# Patient Record
Sex: Female | Born: 1935 | Race: Black or African American | Hispanic: No | Smoking: Never smoker
Health system: Southern US, Community
[De-identification: ages and names within clinical notes are randomized; demographics above are authoritative.]

## PROBLEM LIST (undated history)

## (undated) DIAGNOSIS — I10 Essential (primary) hypertension: Secondary | ICD-10-CM

## (undated) DIAGNOSIS — E785 Hyperlipidemia, unspecified: Secondary | ICD-10-CM

## (undated) DIAGNOSIS — H269 Unspecified cataract: Secondary | ICD-10-CM

## (undated) DIAGNOSIS — F0390 Unspecified dementia without behavioral disturbance: Secondary | ICD-10-CM

## (undated) HISTORY — DX: Essential (primary) hypertension: I10

## (undated) HISTORY — DX: Hyperlipidemia, unspecified: E78.5

## (undated) HISTORY — PX: VAGINAL HYSTERECTOMY: SUR661

## (undated) HISTORY — PX: KNEE SURGERY: SHX244

---

## 1997-10-12 ENCOUNTER — Ambulatory Visit (HOSPITAL_COMMUNITY): Admission: RE | Admit: 1997-10-12 | Discharge: 1997-10-12 | Payer: Self-pay | Admitting: Gastroenterology

## 1998-02-05 ENCOUNTER — Ambulatory Visit (HOSPITAL_COMMUNITY): Admission: RE | Admit: 1998-02-05 | Discharge: 1998-02-05 | Payer: Self-pay | Admitting: Internal Medicine

## 1998-10-16 ENCOUNTER — Ambulatory Visit (HOSPITAL_COMMUNITY): Admission: RE | Admit: 1998-10-16 | Discharge: 1998-10-16 | Payer: Self-pay | Admitting: Internal Medicine

## 1998-10-16 ENCOUNTER — Encounter: Payer: Self-pay | Admitting: Internal Medicine

## 1999-02-28 ENCOUNTER — Encounter: Payer: Self-pay | Admitting: Internal Medicine

## 1999-02-28 ENCOUNTER — Ambulatory Visit (HOSPITAL_COMMUNITY): Admission: RE | Admit: 1999-02-28 | Discharge: 1999-02-28 | Payer: Self-pay | Admitting: Internal Medicine

## 1999-10-26 ENCOUNTER — Emergency Department (HOSPITAL_COMMUNITY): Admission: EM | Admit: 1999-10-26 | Discharge: 1999-10-26 | Payer: Self-pay | Admitting: Emergency Medicine

## 1999-10-26 ENCOUNTER — Encounter: Payer: Self-pay | Admitting: Emergency Medicine

## 1999-11-26 ENCOUNTER — Encounter: Payer: Self-pay | Admitting: Internal Medicine

## 1999-11-26 ENCOUNTER — Ambulatory Visit (HOSPITAL_COMMUNITY): Admission: RE | Admit: 1999-11-26 | Discharge: 1999-11-26 | Payer: Self-pay | Admitting: Internal Medicine

## 2000-02-15 ENCOUNTER — Emergency Department (HOSPITAL_COMMUNITY): Admission: EM | Admit: 2000-02-15 | Discharge: 2000-02-15 | Payer: Self-pay | Admitting: Emergency Medicine

## 2000-04-23 ENCOUNTER — Encounter: Admission: RE | Admit: 2000-04-23 | Discharge: 2000-04-30 | Payer: Self-pay | Admitting: Family Medicine

## 2000-09-11 ENCOUNTER — Encounter: Payer: Self-pay | Admitting: Orthopedic Surgery

## 2000-09-16 ENCOUNTER — Inpatient Hospital Stay (HOSPITAL_COMMUNITY): Admission: RE | Admit: 2000-09-16 | Discharge: 2000-09-21 | Payer: Self-pay | Admitting: Orthopedic Surgery

## 2000-09-16 ENCOUNTER — Encounter: Payer: Self-pay | Admitting: Orthopedic Surgery

## 2000-10-15 ENCOUNTER — Encounter: Admission: RE | Admit: 2000-10-15 | Discharge: 2000-11-13 | Payer: Self-pay | Admitting: Orthopedic Surgery

## 2000-11-26 ENCOUNTER — Encounter: Payer: Self-pay | Admitting: Internal Medicine

## 2000-11-26 ENCOUNTER — Ambulatory Visit (HOSPITAL_COMMUNITY): Admission: RE | Admit: 2000-11-26 | Discharge: 2000-11-26 | Payer: Self-pay | Admitting: Internal Medicine

## 2000-12-14 ENCOUNTER — Encounter: Payer: Self-pay | Admitting: Orthopedic Surgery

## 2000-12-14 ENCOUNTER — Inpatient Hospital Stay (HOSPITAL_COMMUNITY): Admission: RE | Admit: 2000-12-14 | Discharge: 2000-12-19 | Payer: Self-pay | Admitting: Orthopedic Surgery

## 2001-01-12 ENCOUNTER — Encounter: Admission: RE | Admit: 2001-01-12 | Discharge: 2001-04-12 | Payer: Self-pay | Admitting: Orthopedic Surgery

## 2001-05-26 ENCOUNTER — Encounter: Payer: Self-pay | Admitting: Internal Medicine

## 2001-05-26 ENCOUNTER — Encounter: Admission: RE | Admit: 2001-05-26 | Discharge: 2001-05-26 | Payer: Self-pay | Admitting: Internal Medicine

## 2001-08-27 ENCOUNTER — Encounter: Admission: RE | Admit: 2001-08-27 | Discharge: 2001-08-27 | Payer: Self-pay | Admitting: Internal Medicine

## 2001-08-27 ENCOUNTER — Encounter: Payer: Self-pay | Admitting: Internal Medicine

## 2002-02-08 ENCOUNTER — Encounter: Admission: RE | Admit: 2002-02-08 | Discharge: 2002-02-08 | Payer: Self-pay | Admitting: Internal Medicine

## 2002-02-08 ENCOUNTER — Encounter: Payer: Self-pay | Admitting: Internal Medicine

## 2002-02-16 ENCOUNTER — Encounter: Admission: RE | Admit: 2002-02-16 | Discharge: 2002-02-16 | Payer: Self-pay | Admitting: Internal Medicine

## 2002-02-16 ENCOUNTER — Encounter: Payer: Self-pay | Admitting: Internal Medicine

## 2002-11-21 ENCOUNTER — Emergency Department (HOSPITAL_COMMUNITY): Admission: EM | Admit: 2002-11-21 | Discharge: 2002-11-21 | Payer: Self-pay

## 2003-02-10 ENCOUNTER — Encounter: Payer: Self-pay | Admitting: Internal Medicine

## 2003-02-10 ENCOUNTER — Encounter: Admission: RE | Admit: 2003-02-10 | Discharge: 2003-02-10 | Payer: Self-pay | Admitting: Internal Medicine

## 2003-11-06 ENCOUNTER — Emergency Department (HOSPITAL_COMMUNITY): Admission: EM | Admit: 2003-11-06 | Discharge: 2003-11-06 | Payer: Self-pay | Admitting: Emergency Medicine

## 2004-02-23 ENCOUNTER — Encounter: Admission: RE | Admit: 2004-02-23 | Discharge: 2004-02-23 | Payer: Self-pay | Admitting: Internal Medicine

## 2004-05-02 ENCOUNTER — Encounter (INDEPENDENT_AMBULATORY_CARE_PROVIDER_SITE_OTHER): Payer: Self-pay | Admitting: Specialist

## 2004-05-02 ENCOUNTER — Ambulatory Visit (HOSPITAL_COMMUNITY): Admission: RE | Admit: 2004-05-02 | Discharge: 2004-05-02 | Payer: Self-pay | Admitting: Gastroenterology

## 2004-10-29 ENCOUNTER — Ambulatory Visit: Payer: Self-pay | Admitting: Oncology

## 2005-01-02 ENCOUNTER — Ambulatory Visit: Payer: Self-pay | Admitting: Oncology

## 2005-04-04 ENCOUNTER — Ambulatory Visit: Payer: Self-pay | Admitting: Oncology

## 2005-04-25 ENCOUNTER — Emergency Department (HOSPITAL_COMMUNITY): Admission: EM | Admit: 2005-04-25 | Discharge: 2005-04-25 | Payer: Self-pay | Admitting: Emergency Medicine

## 2005-06-06 ENCOUNTER — Encounter: Admission: RE | Admit: 2005-06-06 | Discharge: 2005-06-06 | Payer: Self-pay | Admitting: Internal Medicine

## 2005-10-03 ENCOUNTER — Ambulatory Visit: Payer: Self-pay | Admitting: Oncology

## 2005-10-06 LAB — CBC WITH DIFFERENTIAL/PLATELET
BASO%: 0.8 % (ref 0.0–2.0)
Basophils Absolute: 0 10*3/uL (ref 0.0–0.1)
EOS%: 4 % (ref 0.0–7.0)
Eosinophils Absolute: 0.1 10*3/uL (ref 0.0–0.5)
HCT: 38.8 % (ref 34.8–46.6)
HGB: 12.9 g/dL (ref 11.6–15.9)
LYMPH%: 56.6 % — ABNORMAL HIGH (ref 14.0–48.0)
MCH: 26.6 pg (ref 26.0–34.0)
MCHC: 33.1 g/dL (ref 32.0–36.0)
MCV: 80.4 fL — ABNORMAL LOW (ref 81.0–101.0)
MONO#: 0.3 10*3/uL (ref 0.1–0.9)
MONO%: 9.8 % (ref 0.0–13.0)
NEUT#: 0.9 10*3/uL — ABNORMAL LOW (ref 1.5–6.5)
NEUT%: 28.8 % — ABNORMAL LOW (ref 39.6–76.8)
Platelets: 247 10*3/uL (ref 145–400)
RBC: 4.84 10*6/uL (ref 3.70–5.32)
RDW: 14.4 % (ref 11.3–14.5)
WBC: 3.1 10*3/uL — ABNORMAL LOW (ref 3.9–10.0)
lymph#: 1.8 10*3/uL (ref 0.9–3.3)

## 2005-10-10 LAB — ANTI-NEUTROPHILIC CYTOPLASMIC ANTIBODY PANEL: Anti-Neutrophil Cyto Ab, IgG: 1:80 {titer} — ABNORMAL HIGH

## 2006-04-02 ENCOUNTER — Ambulatory Visit: Payer: Self-pay | Admitting: Oncology

## 2006-07-06 ENCOUNTER — Encounter: Admission: RE | Admit: 2006-07-06 | Discharge: 2006-07-06 | Payer: Self-pay | Admitting: Internal Medicine

## 2007-09-17 ENCOUNTER — Encounter: Admission: RE | Admit: 2007-09-17 | Discharge: 2007-09-17 | Payer: Self-pay | Admitting: Family Medicine

## 2007-10-04 ENCOUNTER — Other Ambulatory Visit: Admission: RE | Admit: 2007-10-04 | Discharge: 2007-10-04 | Payer: Self-pay | Admitting: Family Medicine

## 2009-05-31 ENCOUNTER — Emergency Department (HOSPITAL_COMMUNITY): Admission: EM | Admit: 2009-05-31 | Discharge: 2009-05-31 | Payer: Self-pay | Admitting: Emergency Medicine

## 2009-07-31 ENCOUNTER — Encounter: Admission: RE | Admit: 2009-07-31 | Discharge: 2009-07-31 | Payer: Self-pay | Admitting: Gastroenterology

## 2010-09-24 LAB — URINE CULTURE: Colony Count: 25000

## 2010-09-24 LAB — COMPREHENSIVE METABOLIC PANEL
ALT: 17 U/L (ref 0–35)
AST: 20 U/L (ref 0–37)
Albumin: 4.1 g/dL (ref 3.5–5.2)
Alkaline Phosphatase: 59 U/L (ref 39–117)
BUN: 17 mg/dL (ref 6–23)
CO2: 27 mEq/L (ref 19–32)
Calcium: 8.8 mg/dL (ref 8.4–10.5)
Chloride: 105 mEq/L (ref 96–112)
Creatinine, Ser: 0.98 mg/dL (ref 0.4–1.2)
GFR calc Af Amer: >60 mL/min (ref >60)
GFR calc non Af Amer: 55 mL/min — ABNORMAL LOW (ref >60)
Glucose, Bld: 115 mg/dL — ABNORMAL HIGH (ref 70–99)
Potassium: 3.6 mEq/L (ref 3.5–5.1)
Sodium: 139 mEq/L (ref 135–145)
Total Bilirubin: 0.9 mg/dL (ref 0.3–1.2)
Total Protein: 7.5 g/dL (ref 6.0–8.3)

## 2010-09-24 LAB — URINALYSIS, ROUTINE W REFLEX MICROSCOPIC
Bilirubin Urine: NEGATIVE
Glucose, UA: NEGATIVE mg/dL
Hgb urine dipstick: NEGATIVE
Ketones, ur: NEGATIVE mg/dL
Nitrite: NEGATIVE
Protein, ur: NEGATIVE mg/dL
Specific Gravity, Urine: 1.025 (ref 1.005–1.030)
Urobilinogen, UA: 0.2 mg/dL (ref 0.0–1.0)
pH: 7.5 (ref 5.0–8.0)

## 2010-09-24 LAB — CBC
HCT: 39.2 % (ref 36.0–46.0)
Hemoglobin: 12.9 g/dL (ref 12.0–15.0)
MCHC: 33 g/dL (ref 30.0–36.0)
MCV: 81.8 fL (ref 78.0–100.0)
Platelets: 223 10*3/uL (ref 150–400)
RBC: 4.79 MIL/uL (ref 3.87–5.11)
RDW: 14.8 % (ref 11.5–15.5)
WBC: 2.3 10*3/uL — ABNORMAL LOW (ref 4.0–10.5)

## 2010-09-24 LAB — URINE MICROSCOPIC-ADD ON

## 2010-09-24 LAB — DIFFERENTIAL
Basophils Absolute: 0 10*3/uL (ref 0.0–0.1)
Basophils Relative: 1 % (ref 0–1)
Eosinophils Absolute: 0 10*3/uL (ref 0.0–0.7)
Eosinophils Relative: 2 % (ref 0–5)
Lymphocytes Relative: 50 % — ABNORMAL HIGH (ref 12–46)
Lymphs Abs: 1.2 10*3/uL (ref 0.7–4.0)
Monocytes Absolute: 0.2 10*3/uL (ref 0.1–1.0)
Monocytes Relative: 9 % (ref 3–12)
Neutro Abs: 0.9 10*3/uL — ABNORMAL LOW (ref 1.7–7.7)
Neutrophils Relative %: 38 % — ABNORMAL LOW (ref 43–77)

## 2010-10-18 ENCOUNTER — Other Ambulatory Visit: Payer: Self-pay | Admitting: Family Medicine

## 2010-10-18 DIAGNOSIS — R4182 Altered mental status, unspecified: Secondary | ICD-10-CM

## 2010-10-24 ENCOUNTER — Ambulatory Visit
Admission: RE | Admit: 2010-10-24 | Discharge: 2010-10-24 | Disposition: A | Discharge status: Home / Self Care | Payer: Medicare Other | Source: Ambulatory Visit | Attending: Family Medicine | Admitting: Family Medicine

## 2010-10-24 DIAGNOSIS — R4182 Altered mental status, unspecified: Secondary | ICD-10-CM

## 2010-11-08 NOTE — Procedures (Signed)
[place]. [facility]  Patient:    Diana Allison, Diana Allison                     MRN: [id number] Proc. Date: 12/14/00 Adm. Date:  [id number] Attending:  Colbert Ewing                           Procedure Report  PROCEDURE PERFORMED:  ANESTHESIOLOGIST:  Edwin Cap. Zoila Shutter, M.D.  INDICATIONS FOR PROCEDURE: The patient is a 77 year old African-American female with a diagnosis of severe degenerative joint disease of the knee scheduled for total knee replacement and for whom epidural analgesia in the postoperative period has been requested as part of medical surgical management and explained to the patient and understood and accepted preoperatively.  DESCRIPTION OF PROCEDURE:  At the termination of surgery while still under general anesthesia, Ms. Proctor was turned to the left lateral decubitus position. Her back was prepped with Betadine and draped in the usual sterile fashion.  Using a midline approach and loss of resistance technique, a peridural tap was accomplished at the L2-3 interspace with a 17 gauge Tuohy needle after multiple difficult attempts at L1-2 also paramedian with unsatisfactory results.  Aspiration for blood and CSF was negative.  A total of 10 cc of 0.5% lidocaine with 100 mcg of fentanyl was infused.  This was followed by the passage of a peridural catheter 5 cm cephalad.  The catheter was then checked for patency and affixed to the patients back.  The patient was then returned to the supine position, awakened and brought to the post anesthesia care unit where her peridural catheter will be connected to an infusion pump with a mixture of fentanyl 5 mcg per cc and Marcaine 1/16%  at an initial rate of 12 cc per hour to be adjusted as indicated.  There were no complications.  She appeared to do well and will be followed in the usual fashion. DD:  12/14/00 TD:  12/14/00 Job: [id number]

## 2010-11-08 NOTE — Procedures (Signed)
[place]. [facility]  Patient:    Diana Allison, Diana Allison                     MRN: [id number] Proc. Date: 09/16/00 Adm. Date:  [id number] Attending:  Colbert Ewing                           Procedure Report  PROCEDURE PERFORMED:  Epidural catheter placement for postoperative epidural Fentanyl pain control.  The patient had had an epidural placed for postoperative pain control with her last knee surgery and requested that it be used also for postoperative pain control.  This was agreed upon by Dr. Mckinley Jewel, the patients primary surgery.  DESCRIPTION OF PROCEDURE:  After the surgical dressing was applied, the patient was turned to the left lateral decubitus position.  Betadine prep times there was performed on the lumbar region.  Sterile technique was used. A #27 gauge Tuohy needle was inserted in the midline approach at the L3-4 interspace using air loss of resistance.  The epidural space was located on the first pass.  There was no blood or CSF noted.  A nonstyletted catheter was then placed 4 cm into the epidural space and the needle was removed.  There was again a negative aspiration through the catheter.  The catheter was secured to the patients back using Hypafix dressing over 2 x 2 sterile gauze at the insertion site.  Next, preservative free Xylocaine 1% 5 cc 0.9 normal saline 10 cc and 100 mcg Fentanyl was then slowly injected into the epidural catheter.  The patient was turned to the supine position, awoke and extubated, taken to the recovery room in good condition.  There she was placed on a continuous infusion of 5 mcg per cc epidural Fentanyl and 1/16% Marcaine at an initial rate of 14 cc per hour.  She will be evaluated for the next 48 hours for adequacy of pain control at which time anticoagulant therapy will be started and the catheter will be removed.  There were no complications. DD:  09/16/00 TD:  09/17/00 Job: [id number]

## 2010-11-08 NOTE — Op Note (Signed)
[place]. [facility]  Patient:    Diana Allison, Diana Allison                     MRN: [id number] Proc. Date: 12/14/00 Adm. Date:  [id number] Attending:  Colbert Ewing                           Operative Report  PREOPERATIVE DIAGNOSIS:  End-stage degenerative joint disease, right knee, with varus alignment and flexion contracture.  Medial component bone loss.  POSTOPERATIVE DIAGNOSIS:  End-stage degenerative joint disease, right knee, with varus alignment and flexion contracture.  Medial component bone loss.  PROCEDURE:  Right total knee replacement, [employer] prosthesis.  Press-fit #7 posterior-stabilizing femoral component.  Cemented #9 tibial component with 12 mm polyethylene insert.  Appropriate soft tissue balancing.  Cemented, recessed 26 mm patellar component with lateral retinacular release.  SURGEON:  Loreta Ave, M.D.  ASSISTANT:  Arlys John D. Petrarca, P.A.-C.  ANESTHESIA:  General.  ESTIMATED BLOOD LOSS:  Minimal.  TOURNIQUET TIME:  1 hour, 20 minutes.  SPECIMENS:  Excised bone and soft tissue.  CULTURES:  None.  COMPLICATIONS:  None.  DRESSING:  Soft compressive.  DRAIN:  Hemovac x 2.  DESCRIPTION OF PROCEDURE:  Patient brought to the operating room and after adequate anesthesia had been obtained, the right knee examined.  A 5 degree flexion contracture, further flexion to 90 degrees.  Alignment in almost 10 degrees of varus, correctable to about 5 degrees of varus.  Lateral patellar tracking and tethering at the patellofemoral joint.  Tourniquet applied, prepped and draped in the usual sterile fashion.  Exsanguinated with elevation and Esmarch, tourniquet inflated to 375 mmHg.  Straight incision above the patella down to the tibial tubercle.  Medial parapatellar arthrotomy. Significant medial capsular and ligamentous release and advancement as well as posterior capsular release.  Grade 4 changes throughout.  Remnants of  menisci and contracted cruciate ligaments and periarticular spurs all excised.  Distal femur exposed.  The intramedullary guide placed.  Distal cut set at 5 degrees of valgus, removing 10 mm.  Sized for a #7 component.  The jigs put in place, definitive cuts made for a posterior-stabilizing component.  Trial put in place, found to fit well.  Trial removed.  Tibial spines removed with a saw. Appropriate retractor put in place.  Sized for a #9 component.  The intramedullary guide placed, removing only 1 mm from the deficient medial side, with a 5 degree posterior slope cut, protecting collateral structures. Trials put in place with a #9 tibial component, which gave me very slight overhang over cortical bone below.  A #7 component on the femur.  With a 12 mm insert, I had full extension, full flexion, good stability, good alignment, set at 5 degrees of valgus, after appropriate soft tissue release.  Tibia was marked for rotation and then hand-reamed for the keel portion of the component.  Patella was then sized, reamed, and drilled for a 26 mm component. All trials put in place.  Lateral release was necessary to balance the patellofemoral joint, and this was performed with cautery from the inside out. Once complete, good patellofemoral tracking, full extension, full flexion, good stability, good alignment.  All trials removed.  Copious irrigation with a pulse irrigating device.  Cement prepared, placed on the tibial component, which was hammered in place.  Polyethylene attached.  Femoral component was seated.  The knee reduced.  The patellar component  was placed and then compressed in place with the excessive cement removed.  Once the cement had hardened, the knee was re-examined with good alignment, good stability, good motion.  Wound irrigated once again.  Hemovac was placed, brought out through separate stab wounds.  Arthrotomy closed with #1 Vicryl, skin and subcutaneous tissue with Vicryl  and staples.  Margins of the wound and the knee injected with Marcaine.  Sterile compressive dressing applied.  Tourniquet deflated and removed.  Knee immobilizer applied.  Anesthesia reversed, brought to the recovery room.  She tolerated the surgery well with no complications. DD:  12/14/00 TD:  12/15/00 Job: [id number]

## 2010-11-08 NOTE — Consult Note (Signed)
NAME:  Diana Allison, Diana Allison                        ACCOUNT NO.:  [id number]   MEDICAL RECORD NO.:  [id number]                   PATIENT TYPE:  EMS   LOCATION:  MINO                                 FACILITY:  [facility]   PHYSICIAN:  Thora Lance, M.D.               DATE OF BIRTH:  04/15/1933   DATE OF CONSULTATION:  DATE OF DISCHARGE:                                   CONSULTATION   CHIEF COMPLAINT:  Left chest and arm pain.   SUBJECTIVE:  This is a 77 year old white female with a history of  hypertension, GERD, osteoporosis, hyperlipidemia, DJD, who presents with  chest pain. Over the last three days she has had several episodes of pain in  the left lateral chest and under the left breast, which sometimes radiates  into the left upper arm. The pain is described as an ache. It lasts less  than five minutes and then resolves on its own. Tried Mylanta, which does  not help. The pain is not exertional and generally comes on at rest. There  is no shortness of breath, diaphoresis, or nausea. The patient has had  similar pain intermittently for a number of years. She had very similar pain  in 2003 at which time a stress Cardiolite was done by Dr. Candace Cruise and  was normal without evidence of ischemia and a normal ejection fraction. The  patient had similar complaints of pain in March 2005 and visited Dr.  Merril Abbe. He felt that the pain was atypical, likely GI in nature and  placed the patient on Nexium 40 mg a day. She followed up two weeks later  and the pain had resolved on Nexium. She is now off the Nexium and just  takes it on a p.r.n. basis. She has had very similar pain for a number of  years, which has been felt to be reflux related.   PAST MEDICAL HISTORY:  1. GERD.  2. Osteoporosis.  3. Osteoarthritis.  4. Hypertension.  5. Hyperlipidemia.  6. Atypical chest pain.   ALLERGIES:  Not addressed.   MEDICATIONS:  1. Premarin.  2. Zocor.  3. Norvasc.  4. Aspirin.   FAMILY  HISTORY:  Not addressed.   SOCIAL HISTORY:  Accompanied by daughter. Nonsmoker. No alcohol.   PHYSICAL EXAMINATION:  GENERAL:  A well-appearing black female who appears  comfortable in the ER.  VITAL SIGNS:  Blood pressure is initially 176/93 dropping to 119/70, heart  rate 68, respirations 20, temperature 98.2. Oxygen saturation 98% on room  air.  HEENT:  Pupils equal and respond to light. Extraocular movements are intact.  Anicteric. Ears:  TMs clear. Oropharynx clear.  NECK:  Supple. No bruits.  LUNGS:  Clear.  HEART:  Regular rate and rhythm without murmur, gallop, rub.  ABDOMEN:  Soft, nontender. No mass or hepatosplenomegaly.  CHEST WALL:  Nontender.  EXTREMITIES:  No edema.   LABORATORY DATA:  Chemistry:  Sodium  139, potassium 3.7, chloride 106, BUN  11, glucose 91. PT/INR 0.9. CK initially MB was less than 1.0, troponin less  than 0.05, second MB less than 1.0, troponin less than 0.05.   EKG shows sinus bradycardia, occasional PACs. No acute ST-T wave changes.   ASSESSMENT:  Atypical chest pain, suspect this is noncardiac given the  description, negative EKG, normal cardiac enzymes, and a history of very  similar pain over the years with negative cardiac workup. Suspect this could  be either related to GER or possibly cervical DDD.   PLAN:  1. Restart Nexium 40 mg daily.  2. Complete last set of cardiac markers. If negative, the patient can be     discharged home.  3. Follow-up with Dr. Merril Abbe within one week.  4. If the patient has any worsening or change in her symptoms we will see     her again emergently.                                               Thora Lance, M.D.    Delorse Limber  D:  11/06/2003  T:  11/07/2003  Job:  [id number]   cc:   Ike Bene, M.D.  [address]  [place]  [place] [address]  Fax: [phone]

## 2010-11-08 NOTE — Op Note (Signed)
[place]. [facility]  Patient:    Diana Allison, Diana Allison                     MRN: [id number] Proc. Date: 09/16/00 Adm. Date:  [id number] Attending:  Colbert Ewing                           Operative Report  PREOPERATIVE DIAGNOSIS:  Status post left total knee replacement by a different physician with evidence of aseptic loosening and subsidence of tibial compartment.  POSTOPERATIVE DIAGNOSIS: Status post left total knee replacement by a different physician with evidence of aseptic loosening and subsidence of tibial compartment without significant loosening of the patella or femoral components.  OPERATION:  Exploration left total knee replacement with revision of tibial component.  Extensive synovectomy.  Removal of previous cement.  Revision to a size standard extra thickness metallic tibial long stemmed reconstruction component with a 17.5 mm polyethylene rotating platform.  Appropriate soft tissue balancing and lateral retinacular release.  SURGEON:  Loreta Ave, M.D.  ASSISTANT:  Arlys John D. Petrarca, P.A.-C.  ANESTHESIA:  General  ESTIMATED BLOOD LOSS:  Minimal.  TOURNIQUET TIME: One hour and 30 minutes.  SPECIMENS EXCISED:  Soft tissue debride, excised components and retained cement.  CULTURES:  None.  COMPLICATIONS:  None.  DRESSING: Soft compressive.  DRAIN:  Hemovac times two.  DESCRIPTION OF PROCEDURE:  The patient was brought to the operating room and placed on the operating room table in the supine position. After adequate anesthesia had been obtained, tourniquet applied to the upper aspect of the left leg.  Prepped and draped in the usual sterile fashion. Exsanguinated with elevation of Esmarch. Tourniquet inflated to 350 mmHg.  The knee examined. Marked varus valgus instability because of subsidence of the tibial component down into the tibia.  Went from almost 10 degrees of varus to just slight valgus.  Ligaments grossly  stable.  Pseudolaxity because of component subsidence.  Incision was opened with a previous incision from above the patella down to the tibial tubercle.  Skin and subcutaneous tissue was divided.  Medial parapatellar arthrotomy.  Removal of the nonabsorbable sutures that were still in place.  The knee exposed.  Extensive reactive synovitis with discoloration from component breakdown and metallic reaction. Extensive synovectomy.  The femoral component in excellent position, condition and well fixed.  The same for the patella component which still had mobility of the rotating polyethylene portion.  The tibial component was grossly loose and subsided down into the tibia.  Soft tissue release throughout especially on the medial side to reobtain a balanced knee.  The tibial polyethylene was not in bad condition. Polyethylene removed.  With appropriate retractors and protecting the remaining implanted devices, the tibial component was then easily extracted from the tibia itself.  Care was taken to remove all of the reactive synovitis.  All of the retained cement. Intramedullary guide then placed down on the tibia and a 0 degree cut was made in the tibia trying to salvage as much fullness as possible.  This brought me just below the level of the fibular head which was left in place protecting the peroneal nerve and the lateral collateral ligament.  Reasonable bone stalk.  Sized for a standard component.  Multiple drilling into the sclerotic side medially.  Good cancellus bone otherwise.  Distal reaming for the reconstruction tibial long stem component.  Trials were then put in place.  With  the extra thickness standard size tibial component and the 17.5 mm polyethylene insert, I had full motion, good tracking, good stability and no component lift off in flexion. All trials were removed.  The knee was then copiously irrigated with a pulse irrigating device.  All soft tissue debride was removed from  inside the tibial stem portion as well as all recesses.  Cement was then prepared and placed on the tibial component including the undersurface of the tray as well as the proximal 2 to 3 cm where porous coating was in place.  This was hammered down in to place getting it nicely seated and well fixed in the tibia.  Excessive cement was removed. Once this had hardened and solidified within the tibia with good position of the metallic component matching the top of the tibia, the polyethylene was inserted down into the tibial component with the rotating platform well seated in the metallic portion.  The knee was reduced.  Full extension and full flexion.  No component lift off.  Good mobility of the rotating platform.  Patella femoral joint was reduced and lateral release performed in order to rebalance this.  The wound was irrigated once again. I had good motion and stability and tracking. Hemovacs were placed and brought out through separate stab wounds.  Arthrotomy closed with #1 Vicryl.  The skin and subcutaneous tissue with Vicryl and staples.  Margins of the wound injected with Marcaine as was the knee.  A sterile compressive dressing was applied.  Tourniquet was deflated and removed.  Knee immobilizer applied. Anesthesia reversed.  Brought to recovery room.  Tolerated surgery with no complications. DD:  09/17/00 TD:  09/17/00 Job: [id number]

## 2010-11-08 NOTE — Discharge Summary (Signed)
[place]. [facility]  Patient:    ODA, PLACKE                     MRN: [id number] Adm. Date:  [id number] Disc. Date: [id number] Attending:  Colbert Ewing Dictator:   Oris Drone. Petrarca, P.A.-C.                           Discharge Summary  ADMITTING DIAGNOSIS:  Advanced degenerative joint disease of the right knee.  DISCHARGE DIAGNOSES: 1. Advanced degenerative joint disease of the right knee. 2. History of hypertension. 3. Peripheral vascular disease. 4. Cardiomegaly. 5. Status post left total knee revision.  PROCEDURES:  Right total knee replacement.  HISTORY OF PRESENT ILLNESS:  The patient is a 77 year old white female, status post left total knee revision.  Her right knee has now progressed to being intolerable, with pain and difficulty with ambulation and with activities of daily living.  She has failed conservative treatment in regards to her left knee and is now considered for right total knee replacement.  HOSPITAL COURSE:  [age]-year-old female admitted December 14, 2000.  After appropriate laboratory studies were obtained as well as 1 g of Ancef IV on call to the operating room, was taken to the operating room where she underwent a right total knee replacement.  She tolerated the procedure well. She continued postoperatively with Ancef 1 g IV q.8h. x 3 doses.  Begun on heparin 5000 units subcutaneous q.12h. until her Coumadin became therapeutic as per protocol.  Routine medications were continued.  A Foley was placed intraoperatively and discontinued two days postoperatively.  An epidural was placed for pain control postoperatively.  Placed on CPM 0-70 degrees, increased by 10 degrees a day, and she would use this for eight hours per day. PT, OT, and case management were consulted and obtained.  She was allowed to ambulate weightbearing as tolerated on the right with a walker or crutches. Total knee protocol and precautions were  begun.  She had good pain relief postoperatively with her epidural.  On postoperative day #2 she had her dressing changed and Hemovacs pulled.  She did very well with this inpatient hospitalization, without difficulties.  When she was ambulatory and stable, she was discharged on December 19, 2000, to return back to the office in 7-10 days for recheck evaluation.  She was discharged in improved condition.  LABORATORY DATA:  Radiographic studies revealed on December 14, 2000, a well-seated prosthesis, status post right total knee replacement.  On December 10, 2000, hemoglobin 12.5, hematocrit 37.6%, white count 3400, platelets 301,000.  Discharge hemoglobin 9.2, hematocrit 27.3%, white count 6100, platelets 206,000.  On December 18, 2000, chemistries revealed a sodium of 141, potassium 4.3, chloride 107, CO2 28, glucose 96, BUN 13, creatinine 0.7, calcium 9.1, total protein 6.9, albumin 3.5, AST 16, ALT 16, ALP 73, total bilirubin 0.6.  On December 17, 2000, sodium 138, potassium 4.1, chloride 105, CO2 29, glucose 99, BUN 8, creatinine 0.7, and calcium 7.9.  Urinalysis was benign for a voided urine.  She was blood type O positive, antibody screen negative.  DISCHARGE MEDICATIONS: 1. She was given a prescription for Percocet 5/325 1-2 tablets q.4h. p.r.n.    pain. 2. Coumadin 5 mg 2 tablets a day until changed by [place] pharmacist. 3. Continue routine medications.  DISCHARGE INSTRUCTIONS:  Activities as tolerated with her walker. Weightbearing to comfort.  She is not to drive.  There are no diet restrictions.  She is to keep the wound clean and dry.  Change the dressing daily.  Check her wound for infections, redness, as well as drainage and increased temperature on a daily basis.  Home health physical therapy at home, and Coumadin checks by [place].  FOLLOW-UP:  Follow back up in the office in one week for a recheck evaluation.  CONDITION ON DISCHARGE:  Good. DD:  01/11/01 TD:  01/12/01 Job:  [id number]

## 2010-11-08 NOTE — Op Note (Signed)
NAME:  [name], [name] NO.:  [id number]   MEDICAL RECORD NO.:  [id number]          PATIENT TYPE:  AMB   LOCATION:  ENDO                         FACILITY:  [facility]   PHYSICIAN:  Danise Edge, M.D.   DATE OF BIRTH:  Apr 07, 1933   DATE OF PROCEDURE:  05/02/2004  DATE OF DISCHARGE:                                 OPERATIVE REPORT   PROCEDURE:  Screening colonoscopy.   INDICATIONS FOR PROCEDURE:  Ms. Diana Allison is a 77 year old female  born 1933-01-05.  Diana Allison is scheduled to undergo her first screening  colonoscopy with polypectomy to prevent colon cancer.   ENDOSCOPIST:  Danise Edge, M.D.   PREMEDICATION:  Versed 5 mg, Demerol 50 mg.   DESCRIPTION OF PROCEDURE:  After obtaining informed consent, Ms. Diana Allison was  placed in the left lateral decubitus position.  I administered intravenous  Demerol and intravenous Versed to achieve conscious sedation for the  procedure. The patient's blood pressure, oxygen saturation and cardiac  rhythm were monitored throughout the procedure and documented in the medical  record.   Anal inspection and digital rectal exam were normal.  The Olympus adjustable  pediatric colonoscope was introduced into the rectum and advanced to the  cecum. Colonic preparation for the exam today was excellent.   RECTUM:  Normal.   SIGMOID COLON AND DESCENDING COLON:  Normal.   SPLENIC FLEXURE:  Normal.   TRANSVERSE COLON:  Normal.   HEPATIC FLEXURE:  Normal.   ASCENDING COLON:  From the mid ascending colon, a 3 mm sessile polyp was  removed with electrocautery snare.  From the distal ascending colon, a 1 mm  sessile polyp was removed with the cold biopsy forceps.   CECUM AND ILEOCECAL VALVE:  Normal.   ASSESSMENT:  A small polyp was removed from the mid ascending colon and a  diminutive polyp was removed from the distal ascending colon.  Otherwise  normal screening proctocolonoscopy to the cecum.      MJ/MEDQ  D:   05/02/2004  T:  05/02/2004  Job:  [id number]   cc:   Ike Bene, M.D.  [address]  [place]  [place] [address]  Fax: [phone]

## 2010-11-08 NOTE — Discharge Summary (Signed)
[place]. [facility]  Patient:    Diana Allison, Diana Allison                     MRN: [id number] Adm. Date:  [id number] Disc. Date: [id number] Attending:  Colbert Ewing Dictator:   Oris Drone. Petrarca, P.A.-C.                           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  PROCEDURE:  Revision left knee knee replacement.  HISTORY OF PRESENT ILLNESS:  A 77 year old female, status post left total knee replacement, that of a DePuy rotating tibial platform by Dr. Tresa Res in 1995. Most recently, has had pain develop in her left knee.  X-rays reveal loosening of the tibia component.  Because of her pain, discomfort, and radiographic loosening, she is indicated for revision left total knee replacement.  HOSPITAL COURSE:  A 77 year old female admitted September 16, 2000.  After appropriate laboratory studies were obtained as well as 1 g of Ancef IV on call to the operating room, was taken to the operating room where she underwent a revision of the left tibial plateau component.  Tolerated the procedure well.  She was placed on 5000 units of heparin subcu q.12h. until her Coumadin became therapeutic.  Epidural was placed postoperatively for pain management.  Foley was placed intraoperatively.  Consultations with PT, OT, rehab, and social service were initiated.  Ambulation:  Weightbearing as tolerated on the left with a walker.  Knee immobilizer when walking.  CPM zero to 30 degrees increasing by 10 degrees per day.  Total knee protocol was used. She was allowed out of bed to a chair the following day.  Her Foley was discontinued on postop day #2 as well as the epidural.  Did have some problems with nausea and was given Reglan 10 mg p.o/IV q.8h. for nausea.  The remainder of her hospital course was  uneventful.  She was discharged on April 1 to return back to the office in 10 days for staple removal.  LABORATORY AND X-RAY DATA:  EKG showed sinus bradycardia with occasional premature supraventricular complexes, otherwise normal.  Radiographic studies: Chest x-ray of Oct 26, 1999, reveals cardiac enlargement, no active disease.  Laboratory studies:  Preoperatively, September 11, 2000, revealed hemoglobin 13.4, hematocrit 40.2%, white count 3200, and platelets were 301,000.  Discharge hemoglobin 9.7, hematocrit 29.2%, white count 5100, and platelets were 241,000.  Preop chemistries:  Sodium 139, potassium 3.9, chloride 104, CO2 30, glucose 105, BUN 18, creatinine 0.9, calcium 9.1, total protein 7.5, albumin 3.6, AST 21, ALT 17, ALP 55, total bilirubin was 0.7.  Discharge sodium was 140, potassium 3.6, chloride 104, CO2 30, glucose 113, BUN 7, creatinine 0.8, calcium 8.1.  Urinalysis benign for voided urine.  She was a blood type O positive, antibody screen negative.  DISCHARGE MEDICATIONS: 1. Was placed on Coumadin 5 mg q.d. as per [facility]. 2. Iron sulfate 325 mg one q.d. 3. Percocet one to two q.4h. p.r.n. pain  DISCHARGE INSTRUCTIONS:  She will be weightbearing as tolerated with her walker.  No diet restrictions.  Keep the incision clean and dry.  Call if she has problems.  DISCHARGE FOLLOWUP:  She will call the office and schedule an appointment for approximately 10 days for staple removal.  CONDITION AT DISCHARGE:  She was discharged in improved condition. DD:  10/16/00 TD:  10/18/00 Job: [id number]

## 2012-01-27 ENCOUNTER — Ambulatory Visit (INDEPENDENT_AMBULATORY_CARE_PROVIDER_SITE_OTHER): Payer: Medicare Other | Admitting: Family

## 2012-01-27 ENCOUNTER — Encounter: Payer: Self-pay | Admitting: Family

## 2012-01-27 ENCOUNTER — Telehealth: Payer: Self-pay | Admitting: Family

## 2012-01-27 VITALS — BP 160/98 | HR 79 | Temp 98.0°F | Ht 59.0 in | Wt 163.0 lb

## 2012-01-27 DIAGNOSIS — F0390 Unspecified dementia without behavioral disturbance: Secondary | ICD-10-CM

## 2012-01-27 DIAGNOSIS — E7800 Pure hypercholesterolemia, unspecified: Secondary | ICD-10-CM

## 2012-01-27 DIAGNOSIS — I10 Essential (primary) hypertension: Secondary | ICD-10-CM

## 2012-01-27 LAB — LIPID PANEL
HDL: 99.8 mg/dL (ref >39.00)
Total CHOL/HDL Ratio: 2
Triglycerides: 80 mg/dL (ref 0.0–149.0)
VLDL: 16 mg/dL (ref 0.0–40.0)

## 2012-01-27 LAB — BASIC METABOLIC PANEL
BUN: 20 mg/dL (ref 6–23)
CO2: 29 mEq/L (ref 19–32)
Calcium: 8.9 mg/dL (ref 8.4–10.5)
Chloride: 103 mEq/L (ref 96–112)
Creatinine, Ser: 0.8 mg/dL (ref 0.4–1.2)
GFR: 94.85 mL/min (ref >60.00)
Glucose, Bld: 93 mg/dL (ref 70–99)
Potassium: 3.7 mEq/L (ref 3.5–5.1)

## 2012-01-27 LAB — CBC WITH DIFFERENTIAL/PLATELET
Basophils Absolute: 0 10*3/uL (ref 0.0–0.1)
Basophils Relative: 0.7 % (ref 0.0–3.0)
Eosinophils Absolute: 0.1 10*3/uL (ref 0.0–0.7)
Eosinophils Relative: 4 % (ref 0.0–5.0)
Hemoglobin: 12.2 g/dL (ref 12.0–15.0)
Lymphocytes Relative: 53 % — ABNORMAL HIGH (ref 12.0–46.0)
Lymphs Abs: 1.5 10*3/uL (ref 0.7–4.0)
MCHC: 32.5 g/dL (ref 30.0–36.0)
MCV: 82.2 fl (ref 78.0–100.0)
Monocytes Absolute: 0.2 10*3/uL (ref 0.1–1.0)
Monocytes Relative: 8 % (ref 3.0–12.0)
Neutro Abs: 1 10*3/uL — ABNORMAL LOW (ref 1.4–7.7)
Neutrophils Relative %: 34.3 % — ABNORMAL LOW (ref 43.0–77.0)
Platelets: 220 10*3/uL (ref 150.0–400.0)
RBC: 4.56 Mil/uL (ref 3.87–5.11)
RDW: 14 % (ref 11.5–14.6)
WBC: 2.8 10*3/uL — ABNORMAL LOW (ref 4.5–10.5)

## 2012-01-27 LAB — TSH: TSH: 1.3 u[IU]/mL (ref 0.35–5.50)

## 2012-01-27 NOTE — Telephone Encounter (Signed)
Documented

## 2012-01-27 NOTE — Progress Notes (Signed)
  Subjective:    Patient ID: Diana Allison, female    DOB: 10-May-1933, 78 y.o.   MRN: [id number]  HPI 78 year old BF, new patient to the practice is in to be established. She did not bring any of her meds. She has a history of dementia, hypercholesterolemia, and hypertension. She is accompanied by her granddaughter that also do not have much information about her either. She is transferring from [employer].   Review of Systems  Constitutional: Negative.   HENT: Negative.   Eyes: Negative.   Respiratory: Negative.   Cardiovascular: Negative.   Gastrointestinal: Negative.   Genitourinary: Negative.   Musculoskeletal: Negative.   Skin: Negative.   Neurological: Negative.   Hematological: Negative.   Psychiatric/Behavioral: Negative.    Past Medical History  Diagnosis Date  . Hyperlipidemia   . Hypertension     History   Social History  . Marital Status: Widowed    Spouse Name: N/A    Number of Children: N/A  . Years of Education: N/A   Occupational History  . Not on file.   Social History Main Topics  . Smoking status: Never Smoker   . Smokeless tobacco: Not on file  . Alcohol Use: No  . Drug Use: No  . Sexually Active: Not on file   Other Topics Concern  . Not on file   Social History Narrative  . No narrative on file    Past Surgical History  Procedure Date  . Knee surgery     No family history on file.  No Known Allergies  No current outpatient prescriptions on file prior to visit.    BP 160/98  Pulse 79  Temp 98 F (36.7 C) (Oral)  Ht 4\' 11"  (1.499 m)  Wt 163 lb (73.936 kg)  BMI 32.92 kg/m2  SpO2 97%chart    Objective:   Physical Exam  Constitutional: She is oriented to person, place, and time. She appears well-developed and well-nourished.  HENT:  Right Ear: External ear normal.  Left Ear: External ear normal.  Nose: Nose normal.  Mouth/Throat: Oropharynx is clear and moist.  Eyes: Conjunctivae are normal. Pupils are equal,  round, and reactive to light.  Neck: Normal range of motion. Neck supple.  Cardiovascular: Normal rate, regular rhythm and normal heart sounds.   Pulmonary/Chest: Effort normal and breath sounds normal.  Abdominal: Soft. Bowel sounds are normal.  Musculoskeletal: Normal range of motion.  Neurological: She is alert and oriented to person, place, and time.  Skin: Skin is warm and dry.  Psychiatric: She has a normal mood and affect.          Assessment & Plan:  Assessment: Hypercholesterolemia, Hypertension, Dementia  Plan: Labs sent to include BMP, lipids, CBC, TSH. Will notify patient pending results. Granddaughter will call with medication list. Follow-up for CPX in 3-4 weeks and sooner as needed.

## 2012-01-27 NOTE — Telephone Encounter (Signed)
Pts granddaughther called to give a listing on pts meds. Pt takes Nexium 40 mg  once a day, Zetia 10 mg once a day, Meclizine 12.5 mg 1-2 pills or prn for dizziness (can take up to three a day) ,Donepezil 5 mg once a day, Crestor 10 mg once a day, Amlodipine 10 mg once a day.

## 2012-01-27 NOTE — Patient Instructions (Addendum)
Needs colonoscopy and Mammogram  Please call me back so that we may schedule these screening test.   Colonoscopy A colonoscopy is an exam to evaluate your entire colon. In this exam, your colon is cleansed. A long fiberoptic tube is inserted through your rectum and into your colon. The fiberoptic scope (endoscope) is a long bundle of enclosed and very flexible fibers. These fibers transmit light to the area examined and send images from that area to your caregiver. Discomfort is usually minimal. You may be given a drug to help you sleep (sedative) during or prior to the procedure. This exam helps to detect lumps (tumors), polyps, inflammation, and areas of bleeding. Your caregiver may also take a small piece of tissue (biopsy) that will be examined under a microscope. LET YOUR CAREGIVER KNOW ABOUT:   Allergies to food or medicine.   Medicines taken, including vitamins, herbs, eyedrops, over-the-counter medicines, and creams.   Use of steroids (by mouth or creams).   Previous problems with anesthetics or numbing medicines.   History of bleeding problems or blood clots.   Previous surgery.   Other health problems, including diabetes and kidney problems.   Possibility of pregnancy, if this applies.  BEFORE THE PROCEDURE   A clear liquid diet may be required for 2 days before the exam.   Ask your caregiver about changing or stopping your regular medications.   Liquid injections (enemas) or laxatives may be required.   A large amount of electrolyte solution may be given to you to drink over a short period of time. This solution is used to clean out your colon.   You should be present 60 minutes prior to your procedure or as directed by your caregiver.  AFTER THE PROCEDURE   If you received a sedative or pain relieving medication, you will need to arrange for someone to drive you home.   Occasionally, there is a little blood passed with the first bowel movement. Do not be concerned.    FINDING OUT THE RESULTS OF YOUR TEST Not all test results are available during your visit. If your test results are not back during the visit, make an appointment with your caregiver to find out the results. Do not assume everything is normal if you have not heard from your caregiver or the medical facility. It is important for you to follow up on all of your test results. HOME CARE INSTRUCTIONS   It is not unusual to pass moderate amounts of gas and experience mild abdominal cramping following the procedure. This is due to air being used to inflate your colon during the exam. Walking or a warm pack on your belly (abdomen) may help.   You may resume all normal meals and activities after sedatives and medicines have worn off.   Only take over-the-counter or prescription medicines for pain, discomfort, or fever as directed by your caregiver. Do not use aspirin or blood thinners if a biopsy was taken. Consult your caregiver for medicine usage if biopsies were taken.  SEEK IMMEDIATE MEDICAL CARE IF:   You have a fever.   You pass large blood clots or fill a toilet with blood following the procedure. This may also occur 10 to 14 days following the procedure. This is more likely if a biopsy was taken.   You develop abdominal pain that keeps getting worse and cannot be relieved with medicine.  Document Released: 06/06/2000 Document Revised: 05/29/2011 Document Reviewed: 01/20/2008 [employer] Patient Information 2012 [place], [place].   Mammography Mammography  is an X-ray of the breasts to look for changes that are not normal. The X-ray image is called a mammogram. This procedure can screen for breast cancer, can detect cancer early, and can diagnose cancer.  LET YOUR CAREGIVER KNOW ABOUT:  Breast implants.   Previous breast disease, biopsy, or surgery.   If you are breastfeeding.   Medicines taken, including vitamins, herbs, eyedrops, over-the-counter medicines, and creams.   Use of steroids  (by mouth or creams).   Possibility of pregnancy, if this applies.  RISKS AND COMPLICATIONS  Exposure to radiation, but at very low levels.   The results may be misinterpreted.   The results may not be accurate.   Mammography may lead to further tests.   Mammography may not catch certain cancers.  BEFORE THE PROCEDURE  Schedule your test about 7 days after your menstrual period. This is when your breasts are the least tender and have signs of hormone changes.   If you have had a mammography done at a different facility in the past, get the mammogram X-rays or have them sent to your current exam facility in order to compare them.   Wash your breasts and under your arms the day of the test.   Do not wear deodorants, perfumes, or powders anywhere on your body.   Wear clothes that you can change in and out of easily.  PROCEDURE Relax as much as possible during the test. Any discomfort during the test will be very brief. The test should take less than 30 minutes. The following will happen:  You will undress from the waist up and put on a gown.   You will stand in front of the X-ray machine.   Each breast will be placed between 2 plastic or glass plates. The plates will compress your breast for a few seconds.   X-rays will be taken from different angles of the breast.  AFTER THE PROCEDURE  The mammogram will be examined.   Depending on the quality of the images, you may need to repeat certain parts of the test.   Ask when your test results will be ready. Make sure you get your test results.   You may resume normal activities.  Document Released: 06/06/2000 Document Revised: 05/29/2011 Document Reviewed: 03/30/2011 [facility] Patient Information 2012 [place], [place].

## 2012-02-26 ENCOUNTER — Ambulatory Visit: Payer: Medicare Other | Admitting: Family

## 2012-06-30 ENCOUNTER — Encounter: Payer: Self-pay | Admitting: Family

## 2012-06-30 ENCOUNTER — Ambulatory Visit (INDEPENDENT_AMBULATORY_CARE_PROVIDER_SITE_OTHER): Payer: Medicare Other | Admitting: Family

## 2012-06-30 VITALS — BP 172/100 | HR 86 | Wt 166.0 lb

## 2012-06-30 DIAGNOSIS — I10 Essential (primary) hypertension: Secondary | ICD-10-CM

## 2012-06-30 DIAGNOSIS — Z23 Encounter for immunization: Secondary | ICD-10-CM

## 2012-06-30 DIAGNOSIS — F0390 Unspecified dementia without behavioral disturbance: Secondary | ICD-10-CM

## 2012-06-30 DIAGNOSIS — K219 Gastro-esophageal reflux disease without esophagitis: Secondary | ICD-10-CM

## 2012-06-30 DIAGNOSIS — E7800 Pure hypercholesterolemia, unspecified: Secondary | ICD-10-CM

## 2012-06-30 MED ORDER — LOSARTAN POTASSIUM 100 MG PO TABS: 100.0000 mg | ORAL_TABLET | Freq: Every day | ORAL | Status: DC

## 2012-06-30 NOTE — Patient Instructions (Signed)
Week 1: Take Cozaar and Nexium this week. Week 2: Cozaar, Nexium, and Crestor Week 3: Cozaar, Nexium, Crestor, and Zetia Week 4 Cozaar, Nexium, Crestor, Zetia, and Aricept  Recheck in 3 weeks.

## 2012-06-30 NOTE — Progress Notes (Signed)
Subjective:    Patient ID: Diana Allison, female    DOB: [birth year], 79 y.o.   MRN: [id number]  HPI 79 year old [place] American female, nonsmoker is in for a recheck of hypertension, dementia, GERD, hyperlipidemia. Patient has discontinued taking all of her medications except Nexium because she feels 1 is cause and dizziness. She's unsure which medication it is. She denies any headaches, chest pain, palpitations, nausea, vomiting, shortness of breath or edema.   Review of Systems  Constitutional: Negative.   Respiratory: Negative.   Cardiovascular: Negative.   Gastrointestinal: Negative.   Musculoskeletal: Negative.   Skin: Negative.   Neurological: Negative.   Hematological: Negative.    Past Medical History  Diagnosis Date  . Hyperlipidemia   . Hypertension     History   Social History  . Marital Status: Widowed    Spouse Name: N/A    Number of Children: N/A  . Years of Education: N/A   Occupational History  . Not on file.   Social History Main Topics  . Smoking status: Never Smoker   . Smokeless tobacco: Not on file  . Alcohol Use: No  . Drug Use: No  . Sexually Active: Not on file   Other Topics Concern  . Not on file   Social History Narrative  . No narrative on file    Past Surgical History  Procedure Date  . Knee surgery     No family history on file.  No Known Allergies  Current Outpatient Prescriptions on File Prior to Visit  Medication Sig Dispense Refill  . amLODipine (NORVASC) 10 MG tablet Take 10 mg by mouth daily.      Marland Kitchen donepezil (ARICEPT) 5 MG tablet Take 5 mg by mouth at bedtime.      Marland Kitchen esomeprazole (NEXIUM) 40 MG capsule Take 40 mg by mouth daily before breakfast.      . ezetimibe (ZETIA) 10 MG tablet Take 10 mg by mouth daily.      . meclizine (ANTIVERT) 12.5 MG tablet Take 12.5 mg by mouth 3 (three) times daily as needed.      . rosuvastatin (CRESTOR) 10 MG tablet Take 10 mg by mouth daily.      Marland Kitchen losartan (COZAAR) 100 MG  tablet Take 1 tablet (100 mg total) by mouth daily.  30 tablet  3    BP 172/100  Pulse 86  Wt 166 lb (75.297 kg)  SpO2 92%chart    Objective:   Physical Exam  Constitutional: She is oriented to person, place, and time. She appears well-developed and well-nourished.  Neck: Normal range of motion. Neck supple.  Cardiovascular: Normal rate, regular rhythm and normal heart sounds.   Pulmonary/Chest: Effort normal and breath sounds normal.  Abdominal: Soft. Bowel sounds are normal.  Musculoskeletal: Normal range of motion.  Neurological: She is alert and oriented to person, place, and time.  Skin: Skin is warm and dry.  Psychiatric: She has a normal mood and affect.          Assessment & Plan:  Assessment: GERD, hypertension, hyperlipidemia, dementia  Plan: Today we will discontinue Norvasc. Start Cozaar 100 mg once daily due to minimal side effects. I believe that her dizziness may be related to the Norvasc. She will also take Nexium. 2, she will take Cozaar, Nexium, and Crestor. I have given her specific directions on how to reintroduce her medications although weekly basis. R. recheck her blood pressure in 3 weeks with fasting labs. Call the office with any questions  or concerns.

## 2012-07-21 ENCOUNTER — Ambulatory Visit: Payer: Medicare Other | Admitting: Family

## 2012-11-13 ENCOUNTER — Other Ambulatory Visit: Payer: Self-pay | Admitting: Family

## 2013-10-29 ENCOUNTER — Emergency Department (HOSPITAL_BASED_OUTPATIENT_CLINIC_OR_DEPARTMENT_OTHER)
Admission: EM | Admit: 2013-10-29 | Discharge: 2013-10-30 | Disposition: A | Discharge status: Home / Self Care | Payer: Medicare Other | Attending: Emergency Medicine | Admitting: Emergency Medicine

## 2013-10-29 ENCOUNTER — Emergency Department (HOSPITAL_BASED_OUTPATIENT_CLINIC_OR_DEPARTMENT_OTHER): Payer: Medicare Other

## 2013-10-29 ENCOUNTER — Encounter (HOSPITAL_BASED_OUTPATIENT_CLINIC_OR_DEPARTMENT_OTHER): Payer: Self-pay | Admitting: Emergency Medicine

## 2013-10-29 DIAGNOSIS — IMO0002 Reserved for concepts with insufficient information to code with codable children: Secondary | ICD-10-CM | POA: Insufficient documentation

## 2013-10-29 DIAGNOSIS — Z8669 Personal history of other diseases of the nervous system and sense organs: Secondary | ICD-10-CM | POA: Insufficient documentation

## 2013-10-29 DIAGNOSIS — E785 Hyperlipidemia, unspecified: Secondary | ICD-10-CM | POA: Insufficient documentation

## 2013-10-29 DIAGNOSIS — Z79899 Other long term (current) drug therapy: Secondary | ICD-10-CM | POA: Insufficient documentation

## 2013-10-29 DIAGNOSIS — W108XXA Fall (on) (from) other stairs and steps, initial encounter: Secondary | ICD-10-CM | POA: Insufficient documentation

## 2013-10-29 DIAGNOSIS — S0990XA Unspecified injury of head, initial encounter: Secondary | ICD-10-CM | POA: Insufficient documentation

## 2013-10-29 DIAGNOSIS — Y929 Unspecified place or not applicable: Secondary | ICD-10-CM | POA: Insufficient documentation

## 2013-10-29 DIAGNOSIS — Y9301 Activity, walking, marching and hiking: Secondary | ICD-10-CM | POA: Insufficient documentation

## 2013-10-29 DIAGNOSIS — S43401A Unspecified sprain of right shoulder joint, initial encounter: Secondary | ICD-10-CM

## 2013-10-29 DIAGNOSIS — I10 Essential (primary) hypertension: Secondary | ICD-10-CM | POA: Insufficient documentation

## 2013-10-29 HISTORY — DX: Unspecified cataract: H26.9

## 2013-10-29 NOTE — ED Notes (Signed)
Patient transported to CT 

## 2013-10-29 NOTE — ED Provider Notes (Addendum)
CSN: [id number]     Arrival date & time 10/29/13  2152 History  This chart was scribed for Hanley SeamenJohn L Miette Molenda, MD by Danella Maiersaroline Early, ED Scribe. This patient was seen in room MH05/MH05 and the patient's care was started at 11:20 PM.    Chief Complaint  Patient presents with  . Fall   The history is provided by the patient and a relative. No language interpreter was used.   HPI Comments: Diana Allison is a 80 y.o. female who presents to the Emergency Department complaining of tripping and falling while walking up three concrete steps 4 hours ago. Pt states she hit her forehead on the cement but denies LOC. Fall was un-witnessed. Pt states she remembers falling.  She states it took her a while to get up. Family states pt initially remembered the fall but then started asking why she was here in the ER (patient has known dementia). Pt reports resulting moderate frontal headache and right shoulder pain, shoulder pain worse with palpation and less so with movement. She denies vomiting, neck pain, CP, abdominal pain or other injury.   Past Medical History  Diagnosis Date  . Hyperlipidemia   . Hypertension   . Cataract    Past Surgical History  Procedure Laterality Date  . Knee surgery     History reviewed. No pertinent family history. History  Substance Use Topics  . Smoking status: Never Smoker   . Smokeless tobacco: Never Used  . Alcohol Use: No   OB History   Grav Para Term Preterm Abortions TAB SAB Ect Mult Living                 Review of Systems  Cardiovascular: Negative for chest pain.  Gastrointestinal: Negative for vomiting and abdominal pain.  Musculoskeletal: Negative for neck pain.  Neurological: Positive for headaches.   A complete 10 system review of systems was obtained and all systems are negative except as noted in the HPI and PMH.     Allergies  Review of patient's allergies indicates no known allergies.  Home Medications   Prior to Admission medications    Medication Sig Start Date End Date Taking? Authorizing Provider  amLODipine (NORVASC) 10 MG tablet Take 10 mg by mouth daily.    Historical Provider, MD  donepezil (ARICEPT) 5 MG tablet Take 5 mg by mouth at bedtime.    Historical Provider, MD  esomeprazole (NEXIUM) 40 MG capsule Take 40 mg by mouth daily before breakfast.    Historical Provider, MD  ezetimibe (ZETIA) 10 MG tablet Take 10 mg by mouth daily.    Historical Provider, MD  losartan (COZAAR) 100 MG tablet TAKE 1 TABLET BY MOUTH EVERY DAY 11/13/12   Baker PieriniPadonda B Campbell, FNP  meclizine (ANTIVERT) 12.5 MG tablet Take 12.5 mg by mouth 3 (three) times daily as needed.    Historical Provider, MD  rosuvastatin (CRESTOR) 10 MG tablet Take 10 mg by mouth daily.    Historical Provider, MD   BP 139/82  Pulse 70  Temp(Src) 98.1 F (36.7 C) (Oral)  Resp 16  SpO2 98% Physical Exam General: Well-developed, well-nourished female in no acute distress; appearance consistent with age of record HENT: normocephalic; atraumatic. No hemotympanum. Hematoma of the mid forehead.  Eyes: extraocular muscles intact; Left pupil 3mm and reactive. right pupil 4mm and reactive. Neck: supple Heart: regular rate and rhythm; no murmurs, rubs or gallops Lungs: clear to auscultation bilaterally Abdomen: soft; nondistended; nontender; no masses or hepatosplenomegaly; bowel sounds present  Extremities: Tenderness ovf the right shoulder no deformity some pain on ROM but is still moving it. Neurologic: Awake, alert and oriented; motor function intact in all extremities and symmetric; no facial droop Skin: Warm and dry Psychiatric: Normal mood and affect  ED Course  Procedures (including critical care time)  DIAGNOSTIC STUDIES: Oxygen Saturation is 98% on RA, normal by my interpretation.    COORDINATION OF CARE: 11:27 PM- Discussed treatment plan with pt. Pt agrees to plan.   MDM  Nursing notes and vitals signs, including pulse oximetry, reviewed.  Summary  of this visit's results, reviewed by myself:  Imaging Studies: Dg Shoulder Right  10/30/2013   CLINICAL DATA:  Fall with shoulder pain  EXAM: RIGHT SHOULDER - 2+ VIEW  COMPARISON:  None.  FINDINGS: No humerus fracture. No glenohumeral or acromioclavicular dislocation. Degenerative spurring around the mildly narrowed glenohumeral joint. Cortical irregularity and deformity of the lateral right fourth rib. Questionable callus associated with the fracture.  IMPRESSION: 1. Negative for shoulder fracture. 2. Age indeterminate lateral right fourth rib fracture.   Electronically Signed   By: Tiburcio PeaJonathan  Watts M.D.   On: 10/30/2013 00:22   Ct Head Wo Contrast  10/30/2013   CLINICAL DATA:  Fall with head injury  EXAM: CT HEAD WITHOUT CONTRAST  TECHNIQUE: Contiguous axial images were obtained from the base of the skull through the vertex without intravenous contrast.  COMPARISON:  04/25/2005  FINDINGS: Skull and Sinuses:No significant abnormality.  Orbits: Bilateral cataract surgery.  Brain: No evidence of acute abnormality, such as acute infarction, hemorrhage, hydrocephalus, or mass lesion/mass effect.  Marked brain atrophy.  No lobar predilection.  Extensive remote ischemic injury. Small-vessel disease predominates, with remote central left thalamic lacunar infarct and extensive bilateral cerebral white matter ischemic gliosis. The asymmetric white matter injury throughout the left cerebral hemisphere, somewhat sparing the PCA territory, may reflect long-standing advanced carotid stenosis.  IMPRESSION: 1. No acute intracranial injury. 2. Brain atrophy and chronic, extensive ischemic injury (discussed above).   Electronically Signed   By: Tiburcio PeaJonathan  Watts M.D.   On: 10/30/2013 00:29   12:52 AM Patient remains awake and alert. She and family were advised of radiographic findings. Advised to continue ROM of right shoulder to avoid it becoming frozen.   I personally performed the services described in this  documentation, which was scribed in my presence.  The recorded information has been reviewed and considered.   Hanley SeamenJohn L Amal Renbarger, MD 10/30/13 [id number]  Hanley SeamenJohn L Jalecia Leon, MD 10/30/13 [phone]

## 2013-10-29 NOTE — ED Notes (Signed)
Fall against concrete steps striking head against steps

## 2013-10-29 NOTE — ED Notes (Signed)
MD at bedside. 

## 2013-10-29 NOTE — ED Notes (Signed)
Patient transported to X-ray 

## 2013-10-30 NOTE — ED Notes (Signed)
Pt d/c home with family.

## 2014-03-15 ENCOUNTER — Telehealth: Payer: Self-pay | Admitting: Family

## 2014-03-15 NOTE — Telephone Encounter (Addendum)
Pt is walking floors, looking out the windows, anxious, can't get settled.  Even when pt goes out to eat she is up and ready to leave.  Family has to constantly watch her. Pt stuffs her mouth w/ Food and they have to make her drink something. daughter  will come to appt.w/ pt.  Do you need 30 min? If so, no 30 min avail. pls advise where to schedule.  Advised daughter no dpr signed and could not discuss anything  W/ her until one is signed.  pls advise

## 2014-03-19 NOTE — Telephone Encounter (Signed)
Lets do 30 min.

## 2014-03-20 NOTE — Telephone Encounter (Signed)
Can I use the 11:15 same day on Wed?  They can not come on Tues, Thurs or Friday afternoon. Thanks

## 2014-03-20 NOTE — Telephone Encounter (Signed)
Yes, to ED if worsens.

## 2014-03-22 ENCOUNTER — Encounter: Payer: Self-pay | Admitting: Family

## 2014-03-22 ENCOUNTER — Ambulatory Visit (INDEPENDENT_AMBULATORY_CARE_PROVIDER_SITE_OTHER): Payer: Medicare Other | Admitting: Family

## 2014-03-22 VITALS — BP 140/72 | HR 88 | Ht 59.0 in | Wt 152.0 lb

## 2014-03-22 DIAGNOSIS — F03918 Unspecified dementia, unspecified severity, with other behavioral disturbance: Secondary | ICD-10-CM

## 2014-03-22 DIAGNOSIS — K2100 Gastro-esophageal reflux disease with esophagitis, without bleeding: Secondary | ICD-10-CM

## 2014-03-22 DIAGNOSIS — F0391 Unspecified dementia with behavioral disturbance: Secondary | ICD-10-CM

## 2014-03-22 DIAGNOSIS — F0390 Unspecified dementia without behavioral disturbance: Secondary | ICD-10-CM | POA: Insufficient documentation

## 2014-03-22 DIAGNOSIS — I10 Essential (primary) hypertension: Secondary | ICD-10-CM

## 2014-03-22 DIAGNOSIS — K219 Gastro-esophageal reflux disease without esophagitis: Secondary | ICD-10-CM

## 2014-03-22 DIAGNOSIS — R413 Other amnesia: Secondary | ICD-10-CM

## 2014-03-22 DIAGNOSIS — E7800 Pure hypercholesterolemia, unspecified: Secondary | ICD-10-CM

## 2014-03-22 LAB — POCT URINALYSIS DIPSTICK
BILIRUBIN UA: NEGATIVE
Blood, UA: NEGATIVE
Glucose, UA: NEGATIVE
Ketones, UA: NEGATIVE
Nitrite, UA: NEGATIVE
Spec Grav, UA: 1.02
Urobilinogen, UA: 0.2
pH, UA: 6.5

## 2014-03-22 LAB — CBC WITH DIFFERENTIAL/PLATELET
BASOS PCT: 0.4 % (ref 0.0–3.0)
Basophils Absolute: 0 10*3/uL (ref 0.0–0.1)
EOS ABS: 0 10*3/uL (ref 0.0–0.7)
EOS PCT: 1.2 % (ref 0.0–5.0)
HEMATOCRIT: 38.8 % (ref 36.0–46.0)
HEMOGLOBIN: 12.6 g/dL (ref 12.0–15.0)
LYMPHS ABS: 1.5 10*3/uL (ref 0.7–4.0)
Lymphocytes Relative: 39.3 % (ref 12.0–46.0)
MCHC: 32.3 g/dL (ref 30.0–36.0)
MCV: 83.2 fl (ref 78.0–100.0)
MONO ABS: 0.3 10*3/uL (ref 0.1–1.0)
Monocytes Relative: 7.6 % (ref 3.0–12.0)
NEUTROS ABS: 1.9 10*3/uL (ref 1.4–7.7)
Neutrophils Relative %: 51.5 % (ref 43.0–77.0)
Platelets: 209 10*3/uL (ref 150.0–400.0)
RBC: 4.66 Mil/uL (ref 3.87–5.11)
RDW: 14.6 % (ref 11.5–15.5)
WBC: 3.8 10*3/uL — AB (ref 4.0–10.5)

## 2014-03-22 LAB — LIPID PANEL
CHOLESTEROL: 276 mg/dL — AB (ref 0–200)
HDL: 105.9 mg/dL (ref >39.00)
LDL Cholesterol: 154 mg/dL — ABNORMAL HIGH (ref 0–99)
NonHDL: 170.1
TRIGLYCERIDES: 79 mg/dL (ref 0.0–149.0)
Total CHOL/HDL Ratio: 3
VLDL: 15.8 mg/dL (ref 0.0–40.0)

## 2014-03-22 LAB — HEPATIC FUNCTION PANEL
ALBUMIN: 4.2 g/dL (ref 3.5–5.2)
ALT: 16 U/L (ref 0–35)
AST: 20 U/L (ref 0–37)
Alkaline Phosphatase: 52 U/L (ref 39–117)
Bilirubin, Direct: 0 mg/dL (ref 0.0–0.3)
Total Bilirubin: 0.6 mg/dL (ref 0.2–1.2)
Total Protein: 7.7 g/dL (ref 6.0–8.3)

## 2014-03-22 LAB — TSH: TSH: 0.68 u[IU]/mL (ref 0.35–4.50)

## 2014-03-22 LAB — BASIC METABOLIC PANEL
BUN: 30 mg/dL — ABNORMAL HIGH (ref 6–23)
CHLORIDE: 106 meq/L (ref 96–112)
CO2: 30 mEq/L (ref 19–32)
Calcium: 9.6 mg/dL (ref 8.4–10.5)
Creatinine, Ser: 1 mg/dL (ref 0.4–1.2)
GFR: 66.42 mL/min (ref >60.00)
GLUCOSE: 127 mg/dL — AB (ref 70–99)
Potassium: 4.4 mEq/L (ref 3.5–5.1)
Sodium: 143 mEq/L (ref 135–145)

## 2014-03-22 MED ORDER — ESOMEPRAZOLE MAGNESIUM 40 MG PO CPDR: 40.0000 mg | DELAYED_RELEASE_CAPSULE | Freq: Every day | ORAL | Status: DC

## 2014-03-22 NOTE — Patient Instructions (Signed)

## 2014-03-22 NOTE — Progress Notes (Signed)
Pre visit review using our clinic review tool, if applicable. No additional management support is needed unless otherwise documented below in the visit note. 

## 2014-03-22 NOTE — Progress Notes (Signed)
Subjective:    Patient ID: Diana Allison, female    DOB: June 08, 1933, 80 y.o.   MRN: [id number]  HPI 80 year old [place], nonsmoker with a history of hypertension, hyperlipidemia, dementia, and GERD is in today with worsening memory. Her daughters have discontinued Aricept and Namenda because he felt like it wasn't working. She has not been seen in over a year and a half. Had a fall in May and had a CT scan that showed remote injury and atherosclerosis. It has not been seen by neurology in the past. They're complaining that she is beginning to high pains, not sleeping well at night, throwing away things that may have value, and incontinence.   Review of Systems  Constitutional: Positive for appetite change.       Decreased appetite.   HENT: Negative.   Respiratory: Negative.   Cardiovascular: Negative.   Gastrointestinal: Negative.   Endocrine: Negative.   Genitourinary: Negative.   Musculoskeletal: Negative.   Skin: Negative.   Allergic/Immunologic: Negative.   Neurological:       Memory disturbance-worsening   Hematological: Negative.   Psychiatric/Behavioral: Positive for confusion.   Past Medical History  Diagnosis Date  . Hyperlipidemia   . Hypertension   . Cataract     History   Social History  . Marital Status: Widowed    Spouse Name: N/A    Number of Children: N/A  . Years of Education: N/A   Occupational History  . Not on file.   Social History Main Topics  . Smoking status: Never Smoker   . Smokeless tobacco: Never Used  . Alcohol Use: No  . Drug Use: No  . Sexual Activity: No   Other Topics Concern  . Not on file   Social History Narrative  . No narrative on file    Past Surgical History  Procedure Laterality Date  . Knee surgery      No family history on file.  No Known Allergies  Current Outpatient Prescriptions on File Prior to Visit  Medication Sig Dispense Refill  . donepezil (ARICEPT) 5 MG tablet Take 5 mg by  mouth at bedtime.      Marland Kitchen. ezetimibe (ZETIA) 10 MG tablet Take 10 mg by mouth daily.      Marland Kitchen. losartan (COZAAR) 100 MG tablet TAKE 1 TABLET BY MOUTH EVERY DAY  30 tablet  3  . meclizine (ANTIVERT) 12.5 MG tablet Take 12.5 mg by mouth 3 (three) times daily as needed.      . rosuvastatin (CRESTOR) 10 MG tablet Take 10 mg by mouth daily.       No current facility-administered medications on file prior to visit.    BP 140/72  Pulse 88  Ht 4\' 11"  (1.499 m)  Wt 152 lb (68.947 kg)  BMI 30.68 kg/m2chart     Objective:   Physical Exam  Constitutional: She is oriented to person, place, and time. She appears well-developed and well-nourished.  HENT:  Right Ear: External ear normal.  Left Ear: External ear normal.  Nose: Nose normal.  Mouth/Throat: Oropharynx is clear and moist.  Neck: Normal range of motion. Neck supple. No thyromegaly present.  Cardiovascular: Normal rate, regular rhythm and normal heart sounds.   Pulmonary/Chest: Effort normal and breath sounds normal.  Abdominal: Soft. Bowel sounds are normal.  Musculoskeletal: Normal range of motion.  Neurological: She is alert and oriented to person, place, and time.  Skin: Skin is warm and dry.  Psychiatric: She has a  normal mood and affect.          Assessment & Plan:   Jalie was seen today for no specified reason.  Diagnoses and associated orders for this visit:  Unspecified essential hypertension - Basic Metabolic Panel - Hepatic Function Panel - Lipid Panel - POCT urinalysis dipstick  Memory disturbance - CBC with Differential - TSH - Basic Metabolic Panel - Hepatic Function Panel - Lipid Panel - POCT urinalysis dipstick - Ambulatory referral to Neurology  Gastroesophageal reflux disease, esophagitis presence not specified - CBC with Differential  Pure hypercholesterolemia - Lipid Panel  Other Orders - esomeprazole (NEXIUM) 40 MG capsule; Take 1 capsule (40 mg total) by mouth daily before  breakfast.    call the office if any questions or concerns. Recheck in 3 months or sooner as needed.

## 2014-03-23 ENCOUNTER — Ambulatory Visit: Payer: Medicare Other | Admitting: Family

## 2014-03-23 ENCOUNTER — Telehealth: Payer: Self-pay | Admitting: Family

## 2014-03-23 NOTE — Telephone Encounter (Signed)
emmi mailed  °

## 2014-03-28 ENCOUNTER — Emergency Department (HOSPITAL_BASED_OUTPATIENT_CLINIC_OR_DEPARTMENT_OTHER)
Admission: EM | Admit: 2014-03-28 | Discharge: 2014-03-28 | Disposition: A | Discharge status: Home / Self Care | Payer: Medicare Other | Attending: Emergency Medicine | Admitting: Emergency Medicine

## 2014-03-28 ENCOUNTER — Encounter (HOSPITAL_BASED_OUTPATIENT_CLINIC_OR_DEPARTMENT_OTHER): Payer: Self-pay | Admitting: Emergency Medicine

## 2014-03-28 ENCOUNTER — Emergency Department (HOSPITAL_BASED_OUTPATIENT_CLINIC_OR_DEPARTMENT_OTHER): Payer: Medicare Other

## 2014-03-28 DIAGNOSIS — Y92009 Unspecified place in unspecified non-institutional (private) residence as the place of occurrence of the external cause: Secondary | ICD-10-CM | POA: Insufficient documentation

## 2014-03-28 DIAGNOSIS — I10 Essential (primary) hypertension: Secondary | ICD-10-CM | POA: Diagnosis not present

## 2014-03-28 DIAGNOSIS — W1830XA Fall on same level, unspecified, initial encounter: Secondary | ICD-10-CM | POA: Diagnosis not present

## 2014-03-28 DIAGNOSIS — F0390 Unspecified dementia without behavioral disturbance: Secondary | ICD-10-CM | POA: Insufficient documentation

## 2014-03-28 DIAGNOSIS — Y939 Activity, unspecified: Secondary | ICD-10-CM | POA: Diagnosis not present

## 2014-03-28 DIAGNOSIS — Z791 Long term (current) use of non-steroidal anti-inflammatories (NSAID): Secondary | ICD-10-CM | POA: Diagnosis not present

## 2014-03-28 DIAGNOSIS — Z043 Encounter for examination and observation following other accident: Secondary | ICD-10-CM | POA: Diagnosis not present

## 2014-03-28 DIAGNOSIS — Z79899 Other long term (current) drug therapy: Secondary | ICD-10-CM | POA: Insufficient documentation

## 2014-03-28 DIAGNOSIS — E785 Hyperlipidemia, unspecified: Secondary | ICD-10-CM | POA: Diagnosis not present

## 2014-03-28 DIAGNOSIS — W19XXXA Unspecified fall, initial encounter: Secondary | ICD-10-CM

## 2014-03-28 HISTORY — DX: Unspecified dementia, unspecified severity, without behavioral disturbance, psychotic disturbance, mood disturbance, and anxiety: F03.90

## 2014-03-28 MED ORDER — MELOXICAM 7.5 MG PO TABS: 7.5000 mg | ORAL_TABLET | Freq: Every day | ORAL | Status: DC

## 2014-03-28 MED ORDER — NAPROXEN 250 MG PO TABS
500.0000 mg | ORAL_TABLET | Freq: Once | ORAL | Status: AC
  Administered 2014-03-28: 500 mg via ORAL
  Filled 2014-03-28: qty 2

## 2014-03-28 NOTE — ED Provider Notes (Signed)
CSN: [id number]     Arrival date & time 03/28/14  0320 History   First MD Initiated Contact with Patient 03/28/14 [phone]     Chief Complaint  Patient presents with  . Fall     (Consider location/radiation/quality/duration/timing/severity/associated sxs/prior Treatment) Patient is a 80 y.o. female presenting with fall. The history is provided by the EMS personnel and a relative. The history is limited by the condition of the patient (level 5 dementia).  Fall This is a recurrent problem. The problem occurs constantly. The problem has not changed since onset.Pertinent negatives include no chest pain, no abdominal pain, no headaches and no shortness of breath. Nothing aggravates the symptoms. Nothing relieves the symptoms. She has tried nothing for the symptoms. The treatment provided no relief.    Past Medical History  Diagnosis Date  . Hyperlipidemia   . Hypertension   . Cataract   . Dementia    Past Surgical History  Procedure Laterality Date  . Knee surgery     History reviewed. No pertinent family history. History  Substance Use Topics  . Smoking status: Never Smoker   . Smokeless tobacco: Never Used  . Alcohol Use: No   OB History   Grav Para Term Preterm Abortions TAB SAB Ect Mult Living                 Review of Systems  Unable to perform ROS Respiratory: Negative for shortness of breath.   Cardiovascular: Negative for chest pain.  Gastrointestinal: Negative for abdominal pain.  Neurological: Negative for headaches.      Allergies  Review of patient's allergies indicates no known allergies.  Home Medications   Prior to Admission medications   Medication Sig Start Date End Date Taking? Authorizing Provider  donepezil (ARICEPT) 5 MG tablet Take 5 mg by mouth at bedtime.    Historical Provider, MD  esomeprazole (NEXIUM) 40 MG capsule Take 1 capsule (40 mg total) by mouth daily before breakfast. 03/22/14   Baker Pierini, FNP  ezetimibe (ZETIA) 10 MG tablet  Take 10 mg by mouth daily.    Historical Provider, MD  losartan (COZAAR) 100 MG tablet TAKE 1 TABLET BY MOUTH EVERY DAY 11/13/12   Baker Pierini, FNP  meclizine (ANTIVERT) 12.5 MG tablet Take 12.5 mg by mouth 3 (three) times daily as needed.    Historical Provider, MD  meloxicam (MOBIC) 7.5 MG tablet Take 7.5 mg by mouth. 02/02/14 02/02/15  Historical Provider, MD  Memantine HCl ER (NAMENDA XR) 28 MG CP24 Take by mouth. 11/26/13   Historical Provider, MD  rosuvastatin (CRESTOR) 10 MG tablet Take 10 mg by mouth daily.    Historical Provider, MD  sertraline (ZOLOFT) 50 MG tablet Take 50 mg by mouth. 01/17/14   Historical Provider, MD   BP 149/100  Pulse 84  Temp(Src) 98.4 F (36.9 C) (Oral)  Resp 22  SpO2 96% Physical Exam  Constitutional: She is oriented to person, place, and time. She appears well-developed and well-nourished. No distress.  HENT:  Head: Normocephalic and atraumatic. Head is without raccoon's eyes and without Battle's sign.  Right Ear: No mastoid tenderness. No hemotympanum.  Left Ear: No mastoid tenderness. No hemotympanum.  Mouth/Throat: Oropharynx is clear and moist.  Eyes: Conjunctivae are normal. Pupils are equal, round, and reactive to light.  Neck: Normal range of motion. Neck supple.  No step offs or crepitance of the c or t spine  Cardiovascular: Normal rate, regular rhythm and intact distal pulses.   Pulmonary/Chest:  Effort normal and breath sounds normal. No respiratory distress. She has no wheezes. She has no rales.  Abdominal: Soft. Bowel sounds are normal. There is no tenderness. There is no rebound and no guarding.  Pelvis is stable  Musculoskeletal: Normal range of motion. She exhibits no edema and no tenderness.  Neurological: She is alert and oriented to person, place, and time.  Skin: Skin is warm and dry. She is not diaphoretic.  Psychiatric: She has a normal mood and affect.    ED Course  Procedures (including critical care time) Labs  Review Labs Reviewed - No data to display  Imaging Review Dg Chest 2 View  03/28/2014   CLINICAL DATA:  Found on floor; unwitnessed fall. Concern for chest injury. Initial encounter.  EXAM: CHEST  2 VIEW  COMPARISON:  Chest radiograph performed 11/06/2003  FINDINGS: The lungs are well-aerated. Vascular congestion is noted, possibly transient in nature. Mild bilateral atelectasis is seen. There is no evidence of pleural effusion or pneumothorax.  The heart is mildly enlarged. No acute osseous abnormalities are seen.  IMPRESSION: Vascular congestion may be transient in nature. Mild cardiomegaly noted. Mild bilateral atelectasis seen. No displaced rib fractures identified.   Electronically Signed   By: Roanna Raider M.D.   On: 03/28/2014 05:44   Dg Lumbar Spine Complete  03/28/2014   CLINICAL DATA:  Initial evaluation for fall.  EXAM: LUMBAR SPINE - COMPLETE 4+ VIEW  COMPARISON:  Prior study from 07/31/2009  FINDINGS: Five non-rib-bearing lumbar-type vertebral bodies are present. Vertebral bodies are normally aligned with preservation of the normal lumbar lordosis. Vertebral body heights are grossly preserved without evidence of acute fracture. There is no listhesis.  Scattered multilevel degenerative changes present within the lumbar spine, most evident at L5-S1.  No paraspinous soft tissue abnormality.  Diffuse osteopenia present.  IMPRESSION: No acute traumatic injury within the lumbar spine.   Electronically Signed   By: Rise Mu M.D.   On: 03/28/2014 05:50   Dg Pelvis 1-2 Views  03/28/2014   CLINICAL DATA:  Found on floor; unwitnessed fall. Concern for pelvic injury. Initial encounter.  EXAM: PELVIS - 1-2 VIEW  COMPARISON:  None.  FINDINGS: There is no evidence of fracture or dislocation. Both femoral heads are seated normally within their respective acetabula. Mild degenerative change is noted at the pubic symphysis, with chronic cortical irregularity. The sacroiliac joints are  unremarkable in appearance.  The visualized bowel gas pattern is grossly unremarkable in appearance. Scattered phleboliths are noted within the pelvis.  IMPRESSION: No evidence of fracture or dislocation.   Electronically Signed   By: Roanna Raider M.D.   On: 03/28/2014 05:45   Ct Head Wo Contrast  03/28/2014   CLINICAL DATA:  Found on floor at home. Unwitnessed fall. No loss of consciousness. Concern for head or cervical spine injury. Initial encounter.  EXAM: CT HEAD WITHOUT CONTRAST  CT CERVICAL SPINE WITHOUT CONTRAST  TECHNIQUE: Multidetector CT imaging of the head and cervical spine was performed following the standard protocol without intravenous contrast. Multiplanar CT image reconstructions of the cervical spine were also generated.  COMPARISON:  CT of the head performed 10/29/2013, and MRI of the brain performed 10/24/2010  FINDINGS: CT HEAD FINDINGS  There is no evidence of acute infarction, mass lesion, or intra- or extra-axial hemorrhage on CT.  Scattered small chronic infarcts are noted within the left parietal lobe. Chronic lacunar infarcts are also seen within the right basal ganglia and left thalamus. Prominence of the ventricles and  sulci suggests mild cortical volume loss. Scattered periventricular and subcortical white matter change likely reflects small vessel ischemic microangiopathy.  The brainstem and fourth ventricle are within normal limits. No mass effect or midline shift is seen.  There is no evidence of fracture; visualized osseous structures are unremarkable in appearance. The visualized portions of the orbits are within normal limits. The paranasal sinuses and mastoid air cells are well-aerated. No significant soft tissue abnormalities are seen.  CT CERVICAL SPINE FINDINGS  There is no evidence of acute fracture or subluxation. There is mild grade 1 anterolisthesis of C3 on C4 and of C4 on C5, reflecting mild underlying facet disease.  Vertebral bodies demonstrate normal height.  Intervertebral disc spaces are preserved. Prevertebral soft tissues are within normal limits. The visualized neural foramina are grossly unremarkable.  The thyroid gland is unremarkable in appearance. The visualized lung apices are clear. No significant soft tissue abnormalities are seen.  IMPRESSION: 1. No evidence of traumatic intracranial injury or fracture. 2. No evidence of acute fracture or subluxation along the cervical spine. 3. Minimal degenerative change noted along the mid cervical spine. 4. Scattered chronic small infarcts noted within the left parietal lobe. Chronic lacunar infarcts at the right basal ganglia and left thalamus. 5. Scattered small vessel ischemic microangiopathy and mild cortical volume loss.   Electronically Signed   By: Roanna Raider M.D.   On: 03/28/2014 05:37   Ct Cervical Spine Wo Contrast  03/28/2014   CLINICAL DATA:  Found on floor at home. Unwitnessed fall. No loss of consciousness. Concern for head or cervical spine injury. Initial encounter.  EXAM: CT HEAD WITHOUT CONTRAST  CT CERVICAL SPINE WITHOUT CONTRAST  TECHNIQUE: Multidetector CT imaging of the head and cervical spine was performed following the standard protocol without intravenous contrast. Multiplanar CT image reconstructions of the cervical spine were also generated.  COMPARISON:  CT of the head performed 10/29/2013, and MRI of the brain performed 10/24/2010  FINDINGS: CT HEAD FINDINGS  There is no evidence of acute infarction, mass lesion, or intra- or extra-axial hemorrhage on CT.  Scattered small chronic infarcts are noted within the left parietal lobe. Chronic lacunar infarcts are also seen within the right basal ganglia and left thalamus. Prominence of the ventricles and sulci suggests mild cortical volume loss. Scattered periventricular and subcortical white matter change likely reflects small vessel ischemic microangiopathy.  The brainstem and fourth ventricle are within normal limits. No mass effect or  midline shift is seen.  There is no evidence of fracture; visualized osseous structures are unremarkable in appearance. The visualized portions of the orbits are within normal limits. The paranasal sinuses and mastoid air cells are well-aerated. No significant soft tissue abnormalities are seen.  CT CERVICAL SPINE FINDINGS  There is no evidence of acute fracture or subluxation. There is mild grade 1 anterolisthesis of C3 on C4 and of C4 on C5, reflecting mild underlying facet disease.  Vertebral bodies demonstrate normal height. Intervertebral disc spaces are preserved. Prevertebral soft tissues are within normal limits. The visualized neural foramina are grossly unremarkable.  The thyroid gland is unremarkable in appearance. The visualized lung apices are clear. No significant soft tissue abnormalities are seen.  IMPRESSION: 1. No evidence of traumatic intracranial injury or fracture. 2. No evidence of acute fracture or subluxation along the cervical spine. 3. Minimal degenerative change noted along the mid cervical spine. 4. Scattered chronic small infarcts noted within the left parietal lobe. Chronic lacunar infarcts at the right basal ganglia and left  thalamus. 5. Scattered small vessel ischemic microangiopathy and mild cortical volume loss.   Electronically Signed   By: Roanna RaiderJeffery  Chang M.D.   On: 03/28/2014 05:37     EKG Interpretation None      MDM   Final diagnoses:  Fall, initial encounter   Will prescribe mobic for aches scans negative follow up with your PMD    Viola Placeres K Margaretta Chittum-Rasch, MD 03/28/14 [phone]

## 2014-03-28 NOTE — Discharge Instructions (Signed)

## 2014-03-28 NOTE — ED Notes (Signed)
Pt found on floor at home when family responded to noise, no LOC no obvious injury or bruising . Pt has Hx dementia that is worsening noted by increased number of falls

## 2014-04-10 ENCOUNTER — Encounter: Payer: Self-pay | Admitting: Family

## 2014-04-10 ENCOUNTER — Ambulatory Visit (INDEPENDENT_AMBULATORY_CARE_PROVIDER_SITE_OTHER): Payer: Medicare Other | Admitting: Family

## 2014-04-10 VITALS — BP 128/88 | HR 90 | Temp 98.2°F | Wt 149.0 lb

## 2014-04-10 DIAGNOSIS — K5909 Other constipation: Secondary | ICD-10-CM

## 2014-04-10 DIAGNOSIS — K644 Residual hemorrhoidal skin tags: Secondary | ICD-10-CM

## 2014-04-10 DIAGNOSIS — K648 Other hemorrhoids: Secondary | ICD-10-CM

## 2014-04-10 MED ORDER — HYDROCORTISONE ACETATE 25 MG RE SUPP: 25.0000 mg | Freq: Two times a day (BID) | RECTAL | Status: DC

## 2014-04-10 NOTE — Patient Instructions (Signed)

## 2014-04-10 NOTE — Progress Notes (Signed)
Subjective:    Patient ID: Diana Allison, female    DOB: 11/11/1932, 80 y.o.   MRN: [id number]  HPI  80 year old female, with a history of dementia, hypertension, and GERD is in today with complaints of rectal pain and constipation x3 days. Her daughter has been given her Metamucil and attempting Preparation H that has helped some. Denies any rectal bleeding. Pain is worse with sitting.  Review of Systems  Constitutional: Negative.   Respiratory: Negative.   Cardiovascular: Negative.   Gastrointestinal: Positive for constipation and rectal pain. Negative for anal bleeding.  Endocrine: Negative.   Genitourinary: Negative.   Musculoskeletal: Negative.   Skin: Negative.   Psychiatric/Behavioral: Negative.   All other systems reviewed and are negative.  Past Medical History  Diagnosis Date  . Hyperlipidemia   . Hypertension   . Cataract   . Dementia     History   Social History  . Marital Status: Widowed    Spouse Name: N/A    Number of Children: N/A  . Years of Education: N/A   Occupational History  . Not on file.   Social History Main Topics  . Smoking status: Never Smoker   . Smokeless tobacco: Never Used  . Alcohol Use: No  . Drug Use: No  . Sexual Activity: No   Other Topics Concern  . Not on file   Social History Narrative  . No narrative on file    Past Surgical History  Procedure Laterality Date  . Knee surgery      No family history on file.  No Known Allergies  Current Outpatient Prescriptions on File Prior to Visit  Medication Sig Dispense Refill  . donepezil (ARICEPT) 5 MG tablet Take 5 mg by mouth at bedtime.      Marland Kitchen. esomeprazole (NEXIUM) 40 MG capsule Take 1 capsule (40 mg total) by mouth daily before breakfast.  30 capsule  4  . ezetimibe (ZETIA) 10 MG tablet Take 10 mg by mouth daily.      Marland Kitchen. losartan (COZAAR) 100 MG tablet TAKE 1 TABLET BY MOUTH EVERY DAY  30 tablet  3  . meclizine (ANTIVERT) 12.5 MG tablet Take 12.5 mg by mouth 3  (three) times daily as needed.      . meloxicam (MOBIC) 7.5 MG tablet Take 7.5 mg by mouth.      . rosuvastatin (CRESTOR) 10 MG tablet Take 10 mg by mouth daily.      . sertraline (ZOLOFT) 50 MG tablet Take 50 mg by mouth.       No current facility-administered medications on file prior to visit.    BP 128/88  Pulse 90  Temp(Src) 98.2 F (36.8 C) (Oral)  Wt 149 lb (67.586 kg)chart    Objective:   Physical Exam  Constitutional: She is oriented to person, place, and time. She appears well-developed and well-nourished.  HENT:  Right Ear: External ear normal.  Left Ear: External ear normal.  Nose: Nose normal.  Mouth/Throat: Oropharynx is clear and moist.  Neck: Normal range of motion. Neck supple.  Cardiovascular: Normal rate, regular rhythm and normal heart sounds.   Pulmonary/Chest: Effort normal and breath sounds normal.  Genitourinary:     Musculoskeletal: Normal range of motion.  Neurological: She is alert and oriented to person, place, and time.  Skin: Skin is warm and dry.  Psychiatric: She has a normal mood and affect.          Assessment & Plan:  Diana Allison was seen today  for hemorrhoids.  Diagnoses and associated orders for this visit:  External hemorrhoid  Other constipation  Other Orders - hydrocortisone (ANUSOL-HC) 25 MG suppository; Place 1 suppository (25 mg total) rectally 2 (two) times daily.    Suggested sitz baths. Hydrocortisone suppositories as discussed. Increase fiber intake. Consider Colace as a stool softener to help the difficult stools.

## 2014-04-10 NOTE — Progress Notes (Signed)
Pre visit review using our clinic review tool, if applicable. No additional management support is needed unless otherwise documented below in the visit note. 

## 2014-04-24 ENCOUNTER — Ambulatory Visit (INDEPENDENT_AMBULATORY_CARE_PROVIDER_SITE_OTHER): Payer: Medicare Other | Admitting: Neurology

## 2014-04-24 ENCOUNTER — Encounter: Payer: Self-pay | Admitting: Neurology

## 2014-04-24 VITALS — BP 140/88 | HR 85 | Resp 16 | Ht 59.0 in | Wt 145.0 lb

## 2014-04-24 DIAGNOSIS — F03C0 Unspecified dementia, severe, without behavioral disturbance, psychotic disturbance, mood disturbance, and anxiety: Secondary | ICD-10-CM

## 2014-04-24 DIAGNOSIS — F0150 Vascular dementia without behavioral disturbance: Secondary | ICD-10-CM | POA: Insufficient documentation

## 2014-04-24 DIAGNOSIS — F0390 Unspecified dementia without behavioral disturbance: Secondary | ICD-10-CM

## 2014-04-24 NOTE — Progress Notes (Signed)
NEUROLOGY CONSULTATION NOTE  Diana Allison MRN: [id number] DOB: [birth year]  Referring provider: [name], FNP Primary care provider: [name], FNP  Reason for consult:  Memory loss  Thank you for your kind referral of Diana Allison for consultation of the above symptoms. Although her history is well known to you, please allow me to reiterate it for the purpose of our medical record. The patient was accompanied to the clinic by her 2 daughters who provide majority of the history as the patient is a poor historian. Records and images were personally reviewed where available.  HISTORY OF PRESENT ILLNESS: This is a 80 year old right-handed woman with a history of hypertension, hyperlipidemia, and dementia, presenting for neurological evaluation of memory loss.  She is a poor historian. The patient herself reports that her memory is "pretty good." Her daughters started noticing memory changes around 4-5 years ago. This has gradually worsened, and around 1-2 years ago she was diagnosed with dementia by her PCP.  Since then, she has gradually worsened that over the past 5 months she has needed help with performing ADLs such as bathing and dressing. She will need to be coaxed to eat, then starts feeding herself.  They reports her long-term memory is fair, but sometimes she has difficulties recognizing family members. She used to ask the same questions repeatedly, however stopped doing this a few months ago, mostly staying quiet. She has started having hallucinations, asking who is standing in the room with them. She would wake up early morning and start packing, acting secretive and tiptoeing and peeping around the corner, saying they need to get out of the house. She cannot seem to comprehend what she hears around her. She would go to the TV set and start pulling on it, telling people on TV to stop arguing. She constantly moves around, trying to pull a rug or a chair out. She usually  is given Ativan 0.5mg   1-2 doses to help calm her down, sometimes they would give her a sleeping aid to help her sleep through the night. She has fallen 3 times in the past 2 months, she has a walker but cannot comprehend how to use it, carrying it around with her. She has told her children about back pain since the falls. She has been taking Aricept 5mg /day and Namenda 10mg /day for the past 1-2 years with minimal effect, no side effects.  She denies any headaches, dizziness, diplopia, dysarthria, dysphagia, neck pain, bladder dysfunction. Her daughters report hard small stool despite Metamucil, and are concerned about hemorrhoids. They also ask about her hands being blue despite good oxygenation. Her father was diagnosed with dementia in his 2880s.  I personally reviewed MRI brain without contrast done 2012 for memory loss and increasing confusion. There are prominent dilated perivascular spaces in the left cerebral white matter, unchanged from 2006. Chronic ischemic changes in the white matter bilaterally, chronic infarct in the left thalamus. Generalized atrophy of the cerebral cortex is similar to the prior study. Negative for hydrocephalus. Numerous foci of chronic micro hemorrhage in the left parietal lobe may be related to prior trauma or hypertension. She had a head CT without contrast done for fall last 03/28/14 which showed scattered small chronic infarcts are noted within the left parietal lobe. Chronic lacunar infarcts are also seen within the right basal ganglia and left thalamus. Prominence of the ventricles and sulci suggests mild cortical volume loss. Scattered periventricular and subcortical white matter changes seen.  Laboratory Data:  Lab Results  Component Value Date   WBC 3.8* 03/22/2014   HGB 12.6 03/22/2014   HCT 38.8 03/22/2014   MCV 83.2 03/22/2014   PLT 209.0 03/22/2014     Chemistry      Component Value Date/Time   NA 143 03/22/2014 1215   K 4.4 03/22/2014 1215   CL  106 03/22/2014 1215   CO2 30 03/22/2014 1215   BUN 30* 03/22/2014 1215   CREATININE 1.0 03/22/2014 1215      Component Value Date/Time   CALCIUM 9.6 03/22/2014 1215   ALKPHOS 52 03/22/2014 1215   AST 20 03/22/2014 1215   ALT 16 03/22/2014 1215   BILITOT 0.6 03/22/2014 1215     Lab Results  Component Value Date   TSH 0.68 03/22/2014     PAST MEDICAL HISTORY: Past Medical History  Diagnosis Date  . Hyperlipidemia   . Hypertension   . Cataract   . Dementia     PAST SURGICAL HISTORY: Past Surgical History  Procedure Laterality Date  . Knee surgery    . Vaginal hysterectomy      MEDICATIONS: Current Outpatient Prescriptions on File Prior to Visit  Medication Sig Dispense Refill  . donepezil (ARICEPT) 5 MG tablet Take 5 mg by mouth at bedtime.    Marland Kitchen. esomeprazole (NEXIUM) 40 MG capsule Take 1 capsule (40 mg total) by mouth daily before breakfast. 30 capsule 4  . ezetimibe (ZETIA) 10 MG tablet Take 10 mg by mouth daily.    . hydrocortisone (ANUSOL-HC) 25 MG suppository Place 1 suppository (25 mg total) rectally 2 (two) times daily. 10 suppository 0  . losartan (COZAAR) 100 MG tablet TAKE 1 TABLET BY MOUTH EVERY DAY 30 tablet 3  . rosuvastatin (CRESTOR) 10 MG tablet Take 10 mg by mouth daily.    . sertraline (ZOLOFT) 50 MG tablet Take 50 mg by mouth.    . meclizine (ANTIVERT) 12.5 MG tablet Take 12.5 mg by mouth 3 (three) times daily as needed.     No current facility-administered medications on file prior to visit.    ALLERGIES: No Known Allergies  FAMILY HISTORY: No family history on file.  SOCIAL HISTORY: History   Social History  . Marital Status: Widowed    Spouse Name: N/A    Number of Children: N/A  . Years of Education: N/A   Occupational History  . Not on file.   Social History Main Topics  . Smoking status: Never Smoker   . Smokeless tobacco: Never Used  . Alcohol Use: No  . Drug Use: No  . Sexual Activity: No   Other Topics Concern  . Not  on file   Social History Narrative    REVIEW OF SYSTEMS: Constitutional: No fevers, chills, or sweats, no generalized fatigue, change in appetite Eyes: No visual changes, double vision, eye pain Ear, nose and throat: No hearing loss, ear pain, nasal congestion, sore throat Cardiovascular: No chest pain, palpitations Respiratory:  No shortness of breath at rest or with exertion, wheezes GastrointestinaI: No nausea, vomiting, diarrhea, abdominal pain, fecal incontinence Genitourinary:  No dysuria, urinary retention or frequency Musculoskeletal:  No neck pain, back pain Integumentary: No rash, pruritus, skin lesions Neurological: as above Psychiatric: No depression, insomnia, anxiety Endocrine: No palpitations, fatigue, diaphoresis, mood swings, change in appetite, change in weight, increased thirst Hematologic/Lymphatic:  No anemia, purpura, petechiae. Allergic/Immunologic: no itchy/runny eyes, nasal congestion, recent allergic reactions, rashes  PHYSICAL EXAM: Filed Vitals:   04/24/14 1056  BP: 140/88  Pulse:  85  Resp: 16   General: No acute distress, difficulty following commands Head:  Normocephalic/atraumatic Eyes: Fundoscopic exam shows bilateral sharp discs, no vessel changes, exudates, or hemorrhages Neck: supple, no paraspinal tenderness, full range of motion Back: No paraspinal tenderness Heart: regular rate and rhythm Lungs: Clear to auscultation bilaterally. Vascular: No carotid bruits. Skin/Extremities: No rash, no edema Neurological Exam: Mental status: alert and oriented to person, place (doctor's office but did not know city or state), stated her bday is April, then said May, July. Does not know age or president. No dysarthria. Difficulty with simple commands. Would tend to perseverate on an answer (kept repeating "four" or "five" when asked any questions. Fund of knowledge is reduced. Recent and remote memory are impaired. Attention normal, reduced concentration.   Difficulty with naming, answered "four" when asked to name pen.  MMSE - Mini Mental State Exam 04/24/2014  Orientation to time 0  Orientation to Place 1  Registration 2  Attention/ Calculation 0  Recall 0  Language- name 2 objects 1  Language- repeat 1  Language- follow 3 step command 1  Language- read & follow direction 0  Write a sentence 0  Copy design 0  Total score 6   Cranial nerves: CN I: not tested CN II: pupils equal, round and reactive to light, visual fields intact, fundi unremarkable. CN III, IV, VI:  full range of motion, no nystagmus, no ptosis CN V: facial sensation intact CN VII: upper and lower face symmetric CN VIII: hearing intact to finger rub CN IX, X: gag intact, uvula midline CN XI: sternocleidomastoid and trapezius muscles intact CN XII: tongue midline Bulk & Tone: normal, no fasciculations. Motor: 5/5 throughout with no pronator drift. Sensation: intact to light touch, cold, pin, vibration and joint position sense.  No extinction to double simultaneous stimulation.  Romberg test negative Deep Tendon Reflexes: +2 on right UE and LE, +1 on left UE and LE, no ankle clonus Plantar responses: downgoing bilaterally Cerebellar: no incoordination on finger to nose testing but had difficulty following instructions Gait: slow and cautious, hunched down, unable to tandem walk Tremor: none  IMPRESSION: This is a 80 year old right-handed woman with a history of hypertension, hyperlipidemia, and dementia, presenting for evaluation and prognosis of memory loss. We discussed her symptoms, MMSE today is 6/30, indicating severe dementia. Would continue Aricept and Namenda for now. Recent brain imaging did not show any acute changes. We discussed the hallucinations, there is an option to start low dose Seroquel, side effects were discussed, family is hesitant and would like to hold off for now. We discussed home safety, she has 24/7 supervision at her daughter's home. She will  benefit from day programs to help with social interaction. Continue fall precautions. They know to call our office for any problems and will follow-up on a prn basis.   Thank you for allowing me to participate in the care of this patient. Please do not hesitate to call for any questions or concerns.   Patrcia Dolly, M.D.

## 2014-04-24 NOTE — Patient Instructions (Signed)
1. Continue Aricept and Namenda 2. If she starts having more difficulties with hallucinations at night, low dose Seroquel may be an option, call our office for any problems

## 2014-04-26 ENCOUNTER — Telehealth: Payer: Self-pay | Admitting: *Deleted

## 2014-04-26 NOTE — Telephone Encounter (Signed)
Patients daughter called stating she was seen on 04/24/2014 and had a form that need to be filled out by today she was calling to see if it has been competed and fax to the number on the form. Please call  [phone] Or barbra-[phone]

## 2014-04-26 NOTE — Telephone Encounter (Signed)
Form was faxed to # on the form. Patient's daughter/Rita was notified. Original left up front for p/u.

## 2014-05-09 ENCOUNTER — Telehealth: Payer: Self-pay | Admitting: Family

## 2014-05-09 NOTE — Telephone Encounter (Signed)
12/9 at 11:15

## 2014-05-09 NOTE — Telephone Encounter (Signed)
Please schedule 30 min OV to discuss with PeruPadonda

## 2014-05-09 NOTE — Telephone Encounter (Signed)
Patient's daughter would like to talk to you about help with getting home care to give her mom baths and discuss patient's anxiety medication not working.

## 2014-05-22 ENCOUNTER — Telehealth: Payer: Self-pay | Admitting: Family

## 2014-05-22 NOTE — Telephone Encounter (Signed)
Pt's daughter is giving pt the Ativan bid and would like to know if more can be prescribed because she runs out in 2 weeks and they do seem to help some with calming pt down. Please advise

## 2014-05-22 NOTE — Telephone Encounter (Signed)
Pt's daughter states that her anxiety med is written for 1-2 po qd prn and #30. Pt is running out well before a month and daughter would like to discuss with you.

## 2014-05-23 MED ORDER — LORAZEPAM 0.5 MG PO TABS: 0.5000 mg | ORAL_TABLET | Freq: Two times a day (BID) | ORAL | Status: DC

## 2014-05-23 NOTE — Telephone Encounter (Signed)
She can take twice a day. Print prescription.

## 2014-05-23 NOTE — Telephone Encounter (Signed)
Rx faxed

## 2014-05-31 ENCOUNTER — Ambulatory Visit: Payer: Medicare Other | Admitting: Family

## 2014-05-31 ENCOUNTER — Encounter: Payer: Self-pay | Admitting: Family

## 2014-05-31 ENCOUNTER — Ambulatory Visit (INDEPENDENT_AMBULATORY_CARE_PROVIDER_SITE_OTHER): Payer: Medicare Other | Admitting: Family

## 2014-05-31 VITALS — BP 124/74 | HR 102 | Temp 98.0°F | Wt 144.0 lb

## 2014-05-31 DIAGNOSIS — K648 Other hemorrhoids: Secondary | ICD-10-CM

## 2014-05-31 DIAGNOSIS — K5900 Constipation, unspecified: Secondary | ICD-10-CM

## 2014-05-31 DIAGNOSIS — F0390 Unspecified dementia without behavioral disturbance: Secondary | ICD-10-CM

## 2014-05-31 DIAGNOSIS — K644 Residual hemorrhoidal skin tags: Secondary | ICD-10-CM

## 2014-05-31 MED ORDER — POLYETHYLENE GLYCOL 3350 17 GM/SCOOP PO POWD: 17.0000 g | Freq: Every day | ORAL | Status: DC

## 2014-05-31 MED ORDER — PRAMOXINE-HC 1-1 % EX FOAM: Freq: Three times a day (TID) | CUTANEOUS | Status: DC

## 2014-05-31 NOTE — Patient Instructions (Signed)

## 2014-05-31 NOTE — Progress Notes (Signed)
Pre visit review using our clinic review tool, if applicable. No additional management support is needed unless otherwise documented below in the visit note. 

## 2014-05-31 NOTE — Progress Notes (Signed)
Subjective:    Patient ID: Diana Allison, female    DOB: 1932/12/01, 80 y.o.   MRN: [id number]  HPI  80 year old African-American female, nonsmoker with a history of dementia, GERD, constipation, hypertension, hypercholesterolemia, and depression is in with persistent complaints of constipation. Daughters been given her Metamucil without much relief. She was treated for external hemorrhoids at her last office visit that have improved but continues to have 1 that's inflamed. Continues to apply Preparation H. Requesting home health referral for dressing and grooming.  Review of Systems  Constitutional: Negative.   HENT: Negative.   Respiratory: Negative.   Cardiovascular: Negative.   Gastrointestinal: Positive for constipation. Negative for nausea and diarrhea.  Endocrine: Negative.   Genitourinary: Negative.   Musculoskeletal: Negative.   Skin: Negative.   Allergic/Immunologic: Negative.   Neurological: Negative.   Psychiatric/Behavioral: Positive for confusion, sleep disturbance and decreased concentration. Negative for self-injury. The patient is nervous/anxious.    Past Medical History  Diagnosis Date  . Hyperlipidemia   . Hypertension   . Cataract   . Dementia     History   Social History  . Marital Status: Widowed    Spouse Name: N/A    Number of Children: N/A  . Years of Education: N/A   Occupational History  . Not on file.   Social History Main Topics  . Smoking status: Never Smoker   . Smokeless tobacco: Never Used  . Alcohol Use: No  . Drug Use: No  . Sexual Activity: No   Other Topics Concern  . Not on file   Social History Narrative    Past Surgical History  Procedure Laterality Date  . Knee surgery    . Vaginal hysterectomy      No family history on file.  No Known Allergies  Current Outpatient Prescriptions on File Prior to Visit  Medication Sig Dispense Refill  . donepezil (ARICEPT) 5 MG tablet Take 5 mg by mouth at bedtime.    Marland Kitchen.  esomeprazole (NEXIUM) 40 MG capsule Take 1 capsule (40 mg total) by mouth daily before breakfast. 30 capsule 4  . ezetimibe (ZETIA) 10 MG tablet Take 10 mg by mouth daily.    . hydrocortisone (ANUSOL-HC) 25 MG suppository Place 1 suppository (25 mg total) rectally 2 (two) times daily. 10 suppository 0  . LORazepam (ATIVAN) 0.5 MG tablet Take 1 tablet (0.5 mg total) by mouth 2 (two) times daily. 60 tablet 1  . losartan (COZAAR) 100 MG tablet TAKE 1 TABLET BY MOUTH EVERY DAY 30 tablet 3  . memantine (NAMENDA) 10 MG tablet Take 10 mg by mouth daily.    . rosuvastatin (CRESTOR) 10 MG tablet Take 10 mg by mouth daily.    . sertraline (ZOLOFT) 50 MG tablet Take 50 mg by mouth.     No current facility-administered medications on file prior to visit.    BP 124/74 mmHg  Pulse 102  Temp(Src) 98 F (36.7 C) (Oral)  Wt 144 lb (65.318 kg)  SpO2 93%chart    Objective:   Physical Exam  Constitutional: She is oriented to person, place, and time. She appears well-developed and well-nourished.  HENT:  Right Ear: External ear normal.  Left Ear: External ear normal.  Nose: Nose normal.  Mouth/Throat: Oropharynx is clear and moist.  Neck: Normal range of motion. Neck supple.  Cardiovascular: Normal rate, regular rhythm and normal heart sounds.   Pulmonary/Chest: Effort normal and breath sounds normal.  Abdominal: Soft. Bowel sounds are normal.  External  hemorrhoid tags noted, 1 inflamed. Nonthrombosed.  Musculoskeletal: Normal range of motion.  Neurological: She is alert and oriented to person, place, and time.  Skin: Skin is warm and dry.  Psychiatric: She has a normal mood and affect.          Assessment & Plan:  Diana Allison was seen today for no specified reason.  Diagnoses and associated orders for this visit:  Constipation, unspecified constipation type  External hemorrhoid  Dementia, without behavioral disturbance - Ambulatory referral to Home Health  Other Orders - polyethylene  glycol powder (GLYCOLAX/MIRALAX) powder; Take 17 g by mouth daily. - pramoxine-hydrocortisone (EPIFOAM) 1-1 % foam; Apply topically 3 (three) times daily.   call the office with any questions or concerns. Recheck in 3 months and sooner as needed.

## 2014-06-20 ENCOUNTER — Telehealth: Payer: Self-pay | Admitting: Family

## 2014-06-20 NOTE — Telephone Encounter (Signed)
Daughter states at pt's adult day care center, pt's bp has been running high for several weeks.  She would like to know if you think bp med should be increased?

## 2014-06-20 NOTE — Telephone Encounter (Signed)
Needs OV.  

## 2014-06-20 NOTE — Telephone Encounter (Signed)
Please advise 

## 2014-06-20 NOTE — Telephone Encounter (Signed)
Daughter would like an early am appt, first of the year. Only thing available is coum slot. pls advise if ok to use.

## 2014-06-21 NOTE — Telephone Encounter (Signed)
Earliest available appointment slot for morning is 07/04/13 at 10:45am. May schedule pt there

## 2014-06-22 NOTE — Telephone Encounter (Signed)
Mailbox full, cannot accept messages

## 2014-06-29 NOTE — Telephone Encounter (Signed)
Pt's bp has been monitored by nurses at the home, and it has been perfect!  Ms Diana Allison thinks they were using a bad cuff/bp machine, but no more issues at this time

## 2014-07-26 ENCOUNTER — Telehealth: Payer: Self-pay | Admitting: Family

## 2014-07-26 DIAGNOSIS — Z9181 History of falling: Secondary | ICD-10-CM

## 2014-07-26 NOTE — Telephone Encounter (Signed)
Pt saw you for her Hemorid and they are not better.  Pt would like a rx if possible , something to put on them.  [facility] [address]   Also, need someone  more permanent to assist pt at least 5 days /week for home health  [facility] did not work out. Could not come at the times needed.

## 2014-07-27 NOTE — Telephone Encounter (Signed)
This has been a persistent problem. Would you like to consider consult to have them banded?   I will refer to home health

## 2014-07-27 NOTE — Telephone Encounter (Signed)
Pt's daughter does not want pt to go through surgery. Daughter states hemorrhoids does not bother pt but she has the one that will not heal  Per Padonda, continue applying cream and let us know if pt get worse and we will do referral.

## 2014-08-04 ENCOUNTER — Telehealth: Payer: Self-pay | Admitting: Family

## 2014-08-04 NOTE — Telephone Encounter (Signed)
Need an order for Child psychotherapistocial Worker, Home Health Aid, and Occupational Therapy.  Ok to leave order on voicemail if needed.

## 2014-08-07 ENCOUNTER — Telehealth: Payer: Self-pay | Admitting: Family

## 2014-08-07 DIAGNOSIS — K644 Residual hemorrhoidal skin tags: Secondary | ICD-10-CM

## 2014-08-07 NOTE — Telephone Encounter (Signed)
Patient's daughter would like to proceed with a referral to GI.

## 2014-08-07 NOTE — Telephone Encounter (Signed)
Okay, per Padonda. Orders given verbally to Diane.

## 2014-08-08 NOTE — Telephone Encounter (Signed)
Referral was for General Surgery not Gi. Referral placed and pt's daughter is aware

## 2014-08-15 ENCOUNTER — Telehealth: Payer: Self-pay | Admitting: Family

## 2014-08-15 NOTE — Telephone Encounter (Signed)
Verbal order given to Lori  

## 2014-08-15 NOTE — Telephone Encounter (Signed)
lori would like a Presenter, broadcastingsocial worker evaluation for pt. Verbal is ok

## 2014-08-22 ENCOUNTER — Ambulatory Visit: Payer: Medicare Other | Admitting: Internal Medicine

## 2014-08-23 ENCOUNTER — Ambulatory Visit (INDEPENDENT_AMBULATORY_CARE_PROVIDER_SITE_OTHER): Payer: Medicare Other | Admitting: Internal Medicine

## 2014-08-23 ENCOUNTER — Encounter: Payer: Self-pay | Admitting: Internal Medicine

## 2014-08-23 VITALS — BP 160/78 | Temp 99.2°F | Ht 59.0 in | Wt 139.4 lb

## 2014-08-23 DIAGNOSIS — I10 Essential (primary) hypertension: Secondary | ICD-10-CM

## 2014-08-23 DIAGNOSIS — F0390 Unspecified dementia without behavioral disturbance: Secondary | ICD-10-CM

## 2014-08-23 MED ORDER — HYDROCHLOROTHIAZIDE 12.5 MG PO CAPS
12.5000 mg | ORAL_CAPSULE | Freq: Every day | ORAL | Status: DC
Start: 2014-08-23 — End: 2014-11-06

## 2014-08-23 NOTE — Progress Notes (Signed)
Pre visit review using our clinic review tool, if applicable. No additional management support is needed unless otherwise documented below in the visit note.  Chief Complaint  Patient presents with  . Hypertension    HPI: Patient Diana Allison  comes in today for SDA for  new problem evaluation. PCP NA  changed appt from pcp to today cause of ride issues . Here with daughter  caretaker On month elevated   Adult day care  Takes regularly and was noted to be high as with PT [name] . No falling nsaids  etoh new meds  , cv new poulm sx  [facility]   . On meds for years.  .  Losartan  No edema had labs in the past few months  ROS: See pertinent positives and negatives per HPI.  Past Medical History  Diagnosis Date  . Hyperlipidemia   . Hypertension   . Cataract   . Dementia     No family history on file.  History   Social History  . Marital Status: Widowed    Spouse Name: N/A  . Number of Children: N/A  . Years of Education: N/A   Social History Main Topics  . Smoking status: Never Smoker   . Smokeless tobacco: Never Used  . Alcohol Use: No  . Drug Use: No  . Sexual Activity: No   Other Topics Concern  . None   Social History Narrative    Outpatient Encounter Prescriptions as of 08/23/2014  Medication Sig  . donepezil (ARICEPT) 10 MG tablet Take 10 mg by mouth daily.  Marland Kitchen. esomeprazole (NEXIUM) 40 MG capsule Take 1 capsule (40 mg total) by mouth daily before breakfast.  . ezetimibe (ZETIA) 10 MG tablet Take 10 mg by mouth daily.  . hydrocortisone (ANUSOL-HC) 25 MG suppository Place 1 suppository (25 mg total) rectally 2 (two) times daily.  Marland Kitchen. LORazepam (ATIVAN) 0.5 MG tablet Take 1 tablet (0.5 mg total) by mouth 2 (two) times daily.  Marland Kitchen. losartan (COZAAR) 100 MG tablet TAKE 1 TABLET BY MOUTH EVERY DAY  . polyethylene glycol powder (GLYCOLAX/MIRALAX) powder Take 17 g by mouth daily.  . pramoxine-hydrocortisone (EPIFOAM) 1-1 % foam Apply  topically 3 (three) times daily.  . rosuvastatin (CRESTOR) 10 MG tablet Take 10 mg by mouth daily.  . sertraline (ZOLOFT) 50 MG tablet Take 50 mg by mouth.  . [DISCONTINUED] donepezil (ARICEPT) 5 MG tablet Take 5 mg by mouth at bedtime.  . hydrochlorothiazide (MICROZIDE) 12.5 MG capsule Take 1 capsule (12.5 mg total) by mouth daily.  . [DISCONTINUED] memantine (NAMENDA) 10 MG tablet Take 10 mg by mouth daily.    EXAM:  BP 160/78 mmHg  Temp(Src) 99.2 F (37.3 C) (Oral)  Ht 4\' 11"  (1.499 m)  Wt 139 lb 6.4 oz (63.231 kg)  BMI 28.14 kg/m2  Body mass index is 28.14 kg/(m^2).  GENERAL: vitals reviewed and listed above, alert, oriented, appears well hydrated and in no acute distress ccoperateive not oriented couldn remember daughters name  HEENT: atraumatic, conjunctiva  clear, no obvious abnormalities on inspection of external nose and ears OP : no lesion edema or exudate  NECK: no obvious masses on inspection palpation  LUNGS: clear to auscultation bilaterally, no wheezes, rales or rhonchi, g CV: HRRR, no clubbing cyanosis or  peripheral edema nl cap refill  MS: moves all extremities without noticeable focal  abnormality PSYCH: pleasant and cooperative,    bp confirmed in both arsm 158  and 160 rr  BP Readings from Last 3 Encounters:  08/23/14 160/78  05/31/14 124/74  04/24/14 140/88   Wt Readings from Last 3 Encounters:  08/23/14 139 lb 6.4 oz (63.231 kg)  05/31/14 144 lb (65.318 kg)  04/24/14 145 lb (65.772 kg)   Lab Results  Component Value Date   WBC 3.8* 03/22/2014   HGB 12.6 03/22/2014   HCT 38.8 03/22/2014   PLT 209.0 03/22/2014   GLUCOSE 127* 03/22/2014   CHOL 276* 03/22/2014   TRIG 79.0 03/22/2014   HDL 105.90 03/22/2014   LDLDIRECT 128.9 01/27/2012   LDLCALC 154* 03/22/2014   ALT 16 03/22/2014   AST 20 03/22/2014   NA 143 03/22/2014   K 4.4 03/22/2014   CL 106 03/22/2014   CREATININE 1.0 03/22/2014   BUN 30* 03/22/2014   CO2 30 03/22/2014   TSH 0.68  03/22/2014      ASSESSMENT AND PLAN:  Discussed the following assessment and plan:  Essential hypertension - now not controlled per home readings and day care readings  intensify rx low dose diuretic opther option low dose CCB  Dementia, without behavioral disturbance Total visit > 50% spent counseling and coordinating care  meds care etc risk benefit  Goal is below 150 poss 145 depending on response  -Patient advised to return or notify health care team  if symptoms worsen ,persist or new concerns arise.  Patient Instructions  Add low dose medication  Diuretic   Caution for    BP Getting too low.  May not make a difference itn the incontinence but agree taker every 2 hours to void.  Plan basic metabolic panel in 3-4 weeks  Then ROV with padonda campbell in 6-8 weeks or as needed.  Goal is below 150/90    Diana Allison M.D.

## 2014-08-23 NOTE — Patient Instructions (Addendum)
Add low dose medication  Diuretic   Caution for    BP Getting too low.  May not make a difference itn the incontinence but agree taker every 2 hours to void.  Plan basic metabolic panel in 3-4 weeks  Then ROV with padonda campbell in 6-8 weeks or as needed.  Goal is below 150/90

## 2014-09-12 DIAGNOSIS — Z0279 Encounter for issue of other medical certificate: Secondary | ICD-10-CM

## 2014-09-18 ENCOUNTER — Telehealth: Payer: Self-pay | Admitting: Family

## 2014-09-18 NOTE — Telephone Encounter (Signed)
Forms will be complete tomorrow when Oran Reinadonda is back in the office

## 2014-09-18 NOTE — Telephone Encounter (Signed)
Patient's daughter called back. She was given the below message and she stated that she needs the form tomorrow.  She would like to know if another provider can complete the form?

## 2014-09-18 NOTE — Telephone Encounter (Signed)
Attempted to call pt several times.  Mailbox is full and unable to leave message

## 2014-09-18 NOTE — Telephone Encounter (Signed)
Daughter calling to fu on one page paperwork dropped off tues. March 22 at front desk. They need back later today. About pt's abilities to do and what she cannot do. 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

## 2014-09-19 MED ORDER — LOSARTAN POTASSIUM 100 MG PO TABS: 100.0000 mg | ORAL_TABLET | Freq: Every day | ORAL | Status: DC

## 2014-09-19 NOTE — Telephone Encounter (Signed)
Pt's daughter aware forms ready for pick up

## 2014-09-19 NOTE — Telephone Encounter (Addendum)
Daughter called to give info about pt's needs. She states pt is unable to care for herself. She cannot bathe herself, she is incontinent (urinating and BM), her behavior is getting worse, she stays up until 1-2am and gets right back up at 6am. Pt can only walk short distances causing family to have to take wheelchair when they go out. Daughter is tired and doesn't know what to do. She is trying to get help.

## 2014-09-20 ENCOUNTER — Other Ambulatory Visit: Payer: Medicare Other

## 2014-09-25 ENCOUNTER — Other Ambulatory Visit: Payer: Self-pay | Admitting: Family

## 2014-09-26 ENCOUNTER — Other Ambulatory Visit (INDEPENDENT_AMBULATORY_CARE_PROVIDER_SITE_OTHER): Payer: Medicare Other

## 2014-09-26 ENCOUNTER — Other Ambulatory Visit: Payer: Self-pay | Admitting: *Deleted

## 2014-09-26 DIAGNOSIS — E7800 Pure hypercholesterolemia, unspecified: Secondary | ICD-10-CM

## 2014-09-26 DIAGNOSIS — I10 Essential (primary) hypertension: Secondary | ICD-10-CM | POA: Diagnosis not present

## 2014-09-26 DIAGNOSIS — R739 Hyperglycemia, unspecified: Secondary | ICD-10-CM

## 2014-09-26 LAB — LIPID PANEL
CHOL/HDL RATIO: 3
Cholesterol: 260 mg/dL — ABNORMAL HIGH (ref 0–200)
HDL: 92.7 mg/dL (ref >39.00)
LDL Cholesterol: 151 mg/dL — ABNORMAL HIGH (ref 0–99)
NonHDL: 167.3
TRIGLYCERIDES: 82 mg/dL (ref 0.0–149.0)
VLDL: 16.4 mg/dL (ref 0.0–40.0)

## 2014-09-26 LAB — BASIC METABOLIC PANEL
BUN: 29 mg/dL — ABNORMAL HIGH (ref 6–23)
CALCIUM: 9.3 mg/dL (ref 8.4–10.5)
CO2: 36 meq/L — AB (ref 19–32)
Chloride: 101 mEq/L (ref 96–112)
Creatinine, Ser: 0.95 mg/dL (ref 0.40–1.20)
GFR: 72.82 mL/min (ref >60.00)
GLUCOSE: 102 mg/dL — AB (ref 70–99)
Potassium: 3.6 mEq/L (ref 3.5–5.1)
SODIUM: 141 meq/L (ref 135–145)

## 2014-09-26 LAB — HEMOGLOBIN A1C: Hgb A1c MFr Bld: 6.3 % (ref 4.6–6.5)

## 2014-09-27 ENCOUNTER — Ambulatory Visit (INDEPENDENT_AMBULATORY_CARE_PROVIDER_SITE_OTHER): Payer: Self-pay | Admitting: Family Medicine

## 2014-09-27 ENCOUNTER — Telehealth: Payer: Self-pay | Admitting: Family

## 2014-09-27 DIAGNOSIS — Z029 Encounter for administrative examinations, unspecified: Secondary | ICD-10-CM

## 2014-09-27 NOTE — Telephone Encounter (Signed)
Pt request refill of the following:  LORazepam (ATIVAN) 0.5 MG tablet, polyethylene glycol powder (GLYCOLAX/MIRALAX) powder , lidocaine,  hydrocortisone (ANUSOL-HC) 25 MG suppository,  losartan (COZAAR) 100 MG tablet, donepezil (ARICEPT) 10 MG tablet,  ATORVASTATIN,   PT SAID they have been given only 15 pills for the following  Losartan ,donepezil . Atorvastatin they are req 30 days      [facility]am[facility]:  [facility] [address]

## 2014-09-27 NOTE — Telephone Encounter (Signed)
Pt got the time wrong today and wants to know if you will work her in another time. Advised pt that probably not but I would ask.

## 2014-09-27 NOTE — Progress Notes (Signed)
NO SHOW FOR NEW PATIENT VISIT.  Diana GageLorraine M Allison used to see [name]. I made special accomadation to work them in for a new patient visit and then they no showed the appointment. My assistant will call to ensure ok per protocol.

## 2014-09-27 NOTE — Telephone Encounter (Signed)
Pt had an appointment to est care with Dr. Selena BattenKim this morning at 9am and no showed. I attempted to call pt's daughter concerning medications requested to no avail. No voicemail picked up and contact number listed on pt is no longer in service.   Lidocaine ([facility] MD for hemorrhoids) and atorvastatin are not on her current med list so I am not sure who prescribed those previously but Padonda never has. Also, Aricept has not been prescribed by Padonda. Ativan #60 with 1 refill was phoned in and Miralax 527g with 1 refill was escribed. Losartan was filled on 09/19/14 #30 with 3 refills and I have not received a refill request for Anusol.   Note from [facility] says pt should follow up with him when needed. If pt is still having issues with hemorrhoids, daughter should schedule appointment.   Pt also needs appointment with our office

## 2014-09-28 NOTE — Telephone Encounter (Signed)
We made special accomodation for that appointment. I advise reschedule in normal available slot or see Kandee KeenCory if that is too far off.  Thanks.

## 2014-09-28 NOTE — Telephone Encounter (Signed)
Spoke with pt's daughter and scheduled appointment to see Oran Reinadonda to discuss medication

## 2014-09-29 ENCOUNTER — Ambulatory Visit (INDEPENDENT_AMBULATORY_CARE_PROVIDER_SITE_OTHER): Payer: Medicare Other | Admitting: Family

## 2014-09-29 ENCOUNTER — Encounter: Payer: Self-pay | Admitting: Family

## 2014-09-29 ENCOUNTER — Ambulatory Visit: Payer: Medicare Other | Admitting: Family

## 2014-09-29 VITALS — BP 130/90 | HR 88 | Temp 98.1°F | Ht 59.0 in | Wt 138.6 lb

## 2014-09-29 DIAGNOSIS — E7800 Pure hypercholesterolemia, unspecified: Secondary | ICD-10-CM

## 2014-09-29 DIAGNOSIS — F03918 Unspecified dementia, unspecified severity, with other behavioral disturbance: Secondary | ICD-10-CM

## 2014-09-29 DIAGNOSIS — G4700 Insomnia, unspecified: Secondary | ICD-10-CM

## 2014-09-29 DIAGNOSIS — F0391 Unspecified dementia with behavioral disturbance: Secondary | ICD-10-CM

## 2014-09-29 DIAGNOSIS — I10 Essential (primary) hypertension: Secondary | ICD-10-CM

## 2014-09-29 MED ORDER — TRAZODONE HCL 50 MG PO TABS: 50.0000 mg | ORAL_TABLET | Freq: Every day | ORAL | Status: DC

## 2014-09-29 NOTE — Patient Instructions (Signed)
Insomnia Insomnia is frequent trouble falling and/or staying asleep. Insomnia can be a long term problem or a short term problem. Both are common. Insomnia can be a short term problem when the wakefulness is related to a certain stress or worry. Long term insomnia is often related to ongoing stress during waking hours and/or poor sleeping habits. Overtime, sleep deprivation itself can make the problem worse. Every little thing feels more severe because you are overtired and your ability to cope is decreased. CAUSES   Stress, anxiety, and depression.  Poor sleeping habits.  Distractions such as TV in the bedroom.  Naps close to bedtime.  Engaging in emotionally charged conversations before bed.  Technical reading before sleep.  Alcohol and other sedatives. They may make the problem worse. They can hurt normal sleep patterns and normal dream activity.  Stimulants such as caffeine for several hours prior to bedtime.  Pain syndromes and shortness of breath can cause insomnia.  Exercise late at night.  Changing time zones may cause sleeping problems (jet lag). It is sometimes helpful to have someone observe your sleeping patterns. They should look for periods of not breathing during the night (sleep apnea). They should also look to see how long those periods last. If you live alone or observers are uncertain, you can also be observed at a sleep clinic where your sleep patterns will be professionally monitored. Sleep apnea requires a checkup and treatment. Give your caregivers your medical history. Give your caregivers observations your family has made about your sleep.  SYMPTOMS   Not feeling rested in the morning.  Anxiety and restlessness at bedtime.  Difficulty falling and staying asleep. TREATMENT   Your caregiver may prescribe treatment for an underlying medical disorders. Your caregiver can give advice or help if you are using alcohol or other drugs for self-medication. Treatment  of underlying problems will usually eliminate insomnia problems.  Medications can be prescribed for short time use. They are generally not recommended for lengthy use.  Over-the-counter sleep medicines are not recommended for lengthy use. They can be habit forming.  You can promote easier sleeping by making lifestyle changes such as:  Using relaxation techniques that help with breathing and reduce muscle tension.  Exercising earlier in the day.  Changing your diet and the time of your last meal. No night time snacks.  Establish a regular time to go to bed.  Counseling can help with stressful problems and worry.  Soothing music and white noise may be helpful if there are background noises you cannot remove.  Stop tedious detailed work at least one hour before bedtime. HOME CARE INSTRUCTIONS   Keep a diary. Inform your caregiver about your progress. This includes any medication side effects. See your caregiver regularly. Take note of:  Times when you are asleep.  Times when you are awake during the night.  The quality of your sleep.  How you feel the next day. This information will help your caregiver care for you.  Get out of bed if you are still awake after 15 minutes. Read or do some quiet activity. Keep the lights down. Wait until you feel sleepy and go back to bed.  Keep regular sleeping and waking hours. Avoid naps.  Exercise regularly.  Avoid distractions at bedtime. Distractions include watching television or engaging in any intense or detailed activity like attempting to balance the household checkbook.  Develop a bedtime ritual. Keep a familiar routine of bathing, brushing your teeth, climbing into bed at the same   time each night, listening to soothing music. Routines increase the success of falling to sleep faster.  Use relaxation techniques. This can be using breathing and muscle tension release routines. It can also include visualizing peaceful scenes. You can  also help control troubling or intruding thoughts by keeping your mind occupied with boring or repetitive thoughts like the old concept of counting sheep. You can make it more creative like imagining planting one beautiful flower after another in your backyard garden.  During your day, work to eliminate stress. When this is not possible use some of the previous suggestions to help reduce the anxiety that accompanies stressful situations. MAKE SURE YOU:   Understand these instructions.  Will watch your condition.  Will get help right away if you are not doing well or get worse. Document Released: 06/06/2000 Document Revised: 09/01/2011 Document Reviewed: 07/07/2007 Exit[employer] Patient Information 2015 [employer]. This information is not intended to replace advice given to you by your health care provider. Make sure you discuss any questions you have with your health care provider.  

## 2014-09-29 NOTE — Progress Notes (Signed)
Subjective:    Patient ID: Diana Allison, female    DOB: 1932-11-18, 81 y.o.   MRN: [id number]  HPI 81 year old African-American female, nonsmoker with a history of dementia, GERD, hypertension, hyperlipidemia is in today for recheck. Daughter reports that she has had increased episodes of wandering and insomnia at night. They're requesting a sleep date for her at bedtime. So far tolerates medications well. They're considering long-term care placement.   Review of Systems  Constitutional: Negative.   HENT: Negative.   Respiratory: Negative.   Cardiovascular: Negative.   Gastrointestinal: Negative.   Endocrine: Negative.   Genitourinary: Negative.   Musculoskeletal: Negative.   Skin: Negative.   Allergic/Immunologic: Negative.   Neurological: Negative.   Psychiatric/Behavioral: Positive for behavioral problems, sleep disturbance and decreased concentration. The patient is nervous/anxious.    Past Medical History  Diagnosis Date  . Hyperlipidemia   . Hypertension   . Cataract   . Dementia     History   Social History  . Marital Status: Widowed    Spouse Name: N/A  . Number of Children: N/A  . Years of Education: N/A   Occupational History  . Not on file.   Social History Main Topics  . Smoking status: Never Smoker   . Smokeless tobacco: Never Used  . Alcohol Use: No  . Drug Use: No  . Sexual Activity: No   Other Topics Concern  . Not on file   Social History Narrative    Past Surgical History  Procedure Laterality Date  . Knee surgery    . Vaginal hysterectomy      History reviewed. No pertinent family history.  No Known Allergies  Current Outpatient Prescriptions on File Prior to Visit  Medication Sig Dispense Refill  . donepezil (ARICEPT) 10 MG tablet Take 10 mg by mouth daily.  0  . esomeprazole (NEXIUM) 40 MG capsule Take 1 capsule (40 mg total) by mouth daily before breakfast. 30 capsule 4  . ezetimibe (ZETIA) 10 MG tablet Take 10 mg by  mouth daily.    . hydrochlorothiazide (MICROZIDE) 12.5 MG capsule Take 1 capsule (12.5 mg total) by mouth daily. 90 capsule 0  . hydrocortisone (ANUSOL-HC) 25 MG suppository Place 1 suppository (25 mg total) rectally 2 (two) times daily. 10 suppository 0  . LORazepam (ATIVAN) 0.5 MG tablet TAKE 1 TABLET BY MOUTH TWICE A DAY 60 tablet 1  . losartan (COZAAR) 100 MG tablet Take 1 tablet (100 mg total) by mouth daily. 30 tablet 3  . polyethylene glycol powder (GLYCOLAX/MIRALAX) powder TAKE 17 GRAMS MIXED IN LIQUID BY MOUTH DAILY. 527 g 1  . pramoxine-hydrocortisone (EPIFOAM) 1-1 % foam Apply topically 3 (three) times daily. 10 g 0  . rosuvastatin (CRESTOR) 10 MG tablet Take 10 mg by mouth daily.     No current facility-administered medications on file prior to visit.    BP 130/90 mmHg  Pulse 88  Temp(Src) 98.1 F (36.7 C) (Oral)  Ht  (1.499 m)  Wt 138 lb 9.6 oz (62.869 kg)  BMI 27.98 kg/m2chart    Objective:   Physical Exam  Constitutional: She is oriented to person, place, and time. She appears well-developed and well-nourished.  HENT:  Right Ear: External ear normal.  Left Ear: External ear normal.  Nose: Nose normal.  Mouth/Throat: Oropharynx is clear and moist.  Neck: Normal range of motion. Neck supple. No thyromegaly present.  Cardiovascular: Normal rate, regular rhythm and normal heart sounds.   Pulmonary/Chest: Effort normal and  breath sounds normal.  Abdominal: Soft. Bowel sounds are normal.  Musculoskeletal: Normal range of motion.  Neurological: She is alert and oriented to person, place, and time.  Skin: Skin is warm and dry.  Psychiatric: She has a normal mood and affect.          Assessment & Plan:  Diagnoses and all orders for this visit:  Dementia with behavioral disturbance  Insomnia  Essential hypertension  Pure hypercholesterolemia  Other orders -     traZODone (DESYREL) 50 MG tablet; Take 1 tablet (50 mg total) by mouth at  bedtime.   Stops Zoloft start trazodone 50 mg at bedtime. Recheck in 3 months.

## 2014-09-29 NOTE — Progress Notes (Signed)
Pre visit review using our clinic review tool, if applicable. No additional management support is needed unless otherwise documented below in the visit note. 

## 2014-11-06 ENCOUNTER — Other Ambulatory Visit: Payer: Self-pay | Admitting: Internal Medicine

## 2014-11-24 NOTE — Telephone Encounter (Signed)
LM to cb

## 2014-12-12 ENCOUNTER — Other Ambulatory Visit: Payer: Self-pay | Admitting: Family

## 2014-12-17 ENCOUNTER — Other Ambulatory Visit: Payer: Self-pay | Admitting: Internal Medicine

## 2014-12-18 NOTE — Telephone Encounter (Signed)
Denied.  Filled on 11/07/14 for three months.  Request is too soon.

## 2014-12-31 ENCOUNTER — Other Ambulatory Visit: Payer: Self-pay | Admitting: Family

## 2015-01-02 ENCOUNTER — Other Ambulatory Visit: Payer: Self-pay | Admitting: *Deleted

## 2015-01-02 MED ORDER — POLYETHYLENE GLYCOL 3350 17 GM/SCOOP PO POWD: ORAL | Status: DC

## 2015-01-02 NOTE — Telephone Encounter (Signed)
Rx sent in

## 2015-01-15 ENCOUNTER — Encounter (HOSPITAL_COMMUNITY): Payer: Self-pay | Admitting: Emergency Medicine

## 2015-01-15 ENCOUNTER — Emergency Department (HOSPITAL_COMMUNITY): Payer: Medicare Other

## 2015-01-15 ENCOUNTER — Emergency Department (HOSPITAL_COMMUNITY)
Admission: EM | Admit: 2015-01-15 | Discharge: 2015-01-15 | Disposition: A | Discharge status: Home / Self Care | Payer: Medicare Other | Attending: Emergency Medicine | Admitting: Emergency Medicine

## 2015-01-15 DIAGNOSIS — H269 Unspecified cataract: Secondary | ICD-10-CM | POA: Insufficient documentation

## 2015-01-15 DIAGNOSIS — Z79899 Other long term (current) drug therapy: Secondary | ICD-10-CM | POA: Diagnosis not present

## 2015-01-15 DIAGNOSIS — F0390 Unspecified dementia without behavioral disturbance: Secondary | ICD-10-CM | POA: Diagnosis not present

## 2015-01-15 DIAGNOSIS — E785 Hyperlipidemia, unspecified: Secondary | ICD-10-CM | POA: Diagnosis not present

## 2015-01-15 DIAGNOSIS — R42 Dizziness and giddiness: Secondary | ICD-10-CM

## 2015-01-15 DIAGNOSIS — I10 Essential (primary) hypertension: Secondary | ICD-10-CM | POA: Insufficient documentation

## 2015-01-15 LAB — COMPREHENSIVE METABOLIC PANEL
ALT: 17 U/L (ref 14–54)
ANION GAP: 6 (ref 5–15)
AST: 31 U/L (ref 15–41)
Albumin: 3.6 g/dL (ref 3.5–5.0)
Alkaline Phosphatase: 49 U/L (ref 38–126)
BUN: 18 mg/dL (ref 6–20)
CHLORIDE: 104 mmol/L (ref 101–111)
CO2: 31 mmol/L (ref 22–32)
CREATININE: 1.02 mg/dL — AB (ref 0.44–1.00)
Calcium: 8.8 mg/dL — ABNORMAL LOW (ref 8.9–10.3)
GFR calc non Af Amer: 51 mL/min — ABNORMAL LOW (ref >60)
GFR, EST AFRICAN AMERICAN: 59 mL/min — AB (ref >60)
GLUCOSE: 116 mg/dL — AB (ref 65–99)
Potassium: 4 mmol/L (ref 3.5–5.1)
Sodium: 141 mmol/L (ref 135–145)
TOTAL PROTEIN: 6.2 g/dL — AB (ref 6.5–8.1)
Total Bilirubin: 0.8 mg/dL (ref 0.3–1.2)

## 2015-01-15 LAB — CBC WITH DIFFERENTIAL/PLATELET
BASOS PCT: 0 % (ref 0–1)
Basophils Absolute: 0 10*3/uL (ref 0.0–0.1)
Eosinophils Absolute: 0.1 10*3/uL (ref 0.0–0.7)
Eosinophils Relative: 3 % (ref 0–5)
HCT: 36.1 % (ref 36.0–46.0)
Hemoglobin: 11.5 g/dL — ABNORMAL LOW (ref 12.0–15.0)
Lymphocytes Relative: 42 % (ref 12–46)
Lymphs Abs: 1.6 10*3/uL (ref 0.7–4.0)
MCH: 27.1 pg (ref 26.0–34.0)
MCHC: 31.9 g/dL (ref 30.0–36.0)
MCV: 85.1 fL (ref 78.0–100.0)
Monocytes Absolute: 0.4 10*3/uL (ref 0.1–1.0)
Monocytes Relative: 10 % (ref 3–12)
NEUTROS ABS: 1.8 10*3/uL (ref 1.7–7.7)
Neutrophils Relative %: 45 % (ref 43–77)
Platelets: 196 10*3/uL (ref 150–400)
RBC: 4.24 MIL/uL (ref 3.87–5.11)
RDW: 14.1 % (ref 11.5–15.5)
WBC: 3.9 10*3/uL — ABNORMAL LOW (ref 4.0–10.5)

## 2015-01-15 LAB — I-STAT TROPONIN, ED: Troponin i, poc: 0 ng/mL (ref 0.00–0.08)

## 2015-01-15 MED ORDER — SODIUM CHLORIDE 0.9 % IV BOLUS (SEPSIS)
1000.0000 mL | Freq: Once | INTRAVENOUS | Status: AC
  Administered 2015-01-15: 1000 mL via INTRAVENOUS

## 2015-01-15 NOTE — ED Notes (Signed)
Pt family verbalizes understanding of discharge instructions. Wheeled out to family vehicle to be transported safely home. Pt is at baseline, has dementia. NAD on departure.

## 2015-01-15 NOTE — Discharge Instructions (Signed)
Stay hydrated.   Follow up with your doctor.   Return to ER if you have passing out, chest pain, shortness of breath, weakness.

## 2015-01-15 NOTE — ED Notes (Signed)
Per EMS - pt lives at home with family. Family reports getting her up this am for a shower, pt was "woozy" - pt reports feeling woozy, got light headed and dizzy in shower, vomited; no syncopal episode. Family was able to get pt back to bed. On EMS arrival pt was a/o x4. Pt was nauseated. Follows commands appropriately. Negative for orthostatics. VSS. CBG 115, BP 130/90 and HR  bpm. Family believes pt to have undiagnosed dementia. On arrival to ED pt cannot tell me her name. But follows commands appropriately. Confused.

## 2015-01-15 NOTE — ED Provider Notes (Signed)
CSN: [id number]     Arrival date & time 01/15/15  0900 History   First MD Initiated Contact with Patient 01/15/15 0901     Chief Complaint  Patient presents with  . Dizziness     (Consider location/radiation/quality/duration/timing/severity/associated sxs/prior Treatment) The history is provided by the patient, the EMS personnel and a relative.  Diana Allison is a 81 y.o. female hx of HL, HTN, dementia here with dizziness. Patient has been dizzy this morning after her shower. As per the family, patient was tired and was difficult to arouse. They sat her down on the commode but she is still drowsy. They called EMS and she woke up and now is back to her baseline. Denies chest pain or shortness of breath and no previous cardiac history. Patient does have a history of dementia. Denies any history of strokes in the past.  Level V caveat- AMS, dementia    Past Medical History  Diagnosis Date  . Hyperlipidemia   . Hypertension   . Cataract   . Dementia    Past Surgical History  Procedure Laterality Date  . Knee surgery    . Vaginal hysterectomy     History reviewed. No pertinent family history. History  Substance Use Topics  . Smoking status: Never Smoker   . Smokeless tobacco: Never Used  . Alcohol Use: No   OB History    No data available     Review of Systems  Neurological: Positive for dizziness.  All other systems reviewed and are negative.     Allergies  Review of patient's allergies indicates no known allergies.  Home Medications   Prior to Admission medications   Medication Sig Start Date End Date Taking? Authorizing Provider  donepezil (ARICEPT) 10 MG tablet Take 10 mg by mouth daily. 06/30/14   Historical Provider, MD  esomeprazole (NEXIUM) 40 MG capsule Take 1 capsule (40 mg total) by mouth daily before breakfast. 03/22/14   [name], FNP  ezetimibe (ZETIA) 10 MG tablet Take 10 mg by mouth daily.    Historical Provider, MD  hydrochlorothiazide  (MICROZIDE) 12.5 MG capsule TAKE 1 CAPSULE (12.5 MG TOTAL) BY MOUTH DAILY. 11/07/14   [name], FNP  hydrocortisone (ANUSOL-HC) 25 MG suppository Place 1 suppository (25 mg total) rectally 2 (two) times daily. 04/10/14   [name], FNP  LORazepam (ATIVAN) 0.5 MG tablet TAKE 1 TABLET BY MOUTH TWICE A DAY 12/13/14   [name], FNP  losartan (COZAAR) 100 MG tablet Take 1 tablet (100 mg total) by mouth daily. 09/19/14   [name], FNP  polyethylene glycol powder (GLYCOLAX/MIRALAX) powder TAKE 17 GRAMS MIXED IN LIQUID BY MOUTH DAILY. 01/02/15   [name], FNP  pramoxine-hydrocortisone (EPIFOAM) 1-1 % foam Apply topically 3 (three) times daily. 05/31/14   [name], FNP  ranitidine (ZANTAC) 300 MG capsule Take 300 mg by mouth 2 (two) times daily as needed for heartburn.  01/01/15   Historical Provider, MD  rosuvastatin (CRESTOR) 10 MG tablet Take 10 mg by mouth daily.    Historical Provider, MD  traZODone (DESYREL) 50 MG tablet Take 1 tablet (50 mg total) by mouth at bedtime. 09/29/14   [name], FNP   BP 119/67 mmHg  Pulse 58  Resp 18  SpO2 96% Physical Exam  Constitutional: She is oriented to person, place, and time.  Chronically ill, NAD   HENT:  Head: Normocephalic and atraumatic.  Mouth/Throat: Oropharynx is clear and moist.  Eyes: Conjunctivae are normal. Pupils are equal, round, and reactive to light.  Neck: Normal range of motion. Neck supple.  Cardiovascular: Normal rate, regular rhythm and normal heart sounds.   Pulmonary/Chest: Effort normal and breath sounds normal. No respiratory distress. She has no wheezes. She has no rales.  Abdominal: Soft. Bowel sounds are normal. She exhibits no distension. There is no tenderness. There is no rebound.  Musculoskeletal: Normal range of motion. She exhibits no edema or tenderness.  Neurological: She is alert and oriented to person, place, and time.  Nl strength throughout. CN 2-12 intact   Skin: Skin is warm and  dry.  Psychiatric: She has a normal mood and affect. Her behavior is normal. Judgment and thought content normal.  Nursing note and vitals reviewed.   ED Course  Procedures (including critical care time) Labs Review Labs Reviewed  CBC WITH DIFFERENTIAL/PLATELET - Abnormal; Notable for the following:    WBC 3.9 (*)    Hemoglobin 11.5 (*)    All other components within normal limits  COMPREHENSIVE METABOLIC PANEL - Abnormal; Notable for the following:    Glucose, Bld 116 (*)    Creatinine, Ser 1.02 (*)    Calcium 8.8 (*)    Total Protein 6.2 (*)    GFR calc non Af Amer 51 (*)    GFR calc Af Amer 59 (*)    All other components within normal limits  I-STAT TROPOININ, ED    Imaging Review Ct Head Wo Contrast  01/15/2015   CLINICAL DATA:  Intermittent dizziness for 3 days. History of dementia.  EXAM: CT HEAD WITHOUT CONTRAST  TECHNIQUE: Contiguous axial images were obtained from the base of the skull through the vertex without intravenous contrast.  COMPARISON:  03/28/2014.  FINDINGS: No evidence for acute infarction, hemorrhage, mass lesion, hydrocephalus, or extra-axial fluid. Global atrophy. 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 Extensive small vessel disease with scattered chronic lacunar infarcts. Dolichoectasia. BILATERAL cataract surgery. No acute sinus or mastoid disease. Similar appearance to priors.  IMPRESSION: No acute intracranial findings.  Chronic changes as described.   Electronically Signed   By: Elsie Stain M.D.   On: 01/15/2015 11:20     EKG Interpretation   Date/Time:  Monday January 15 2015 09:27:37 EDT Ventricular Rate:  51 PR Interval:  156 QRS Duration: 107 QT Interval:  464 QTC Calculation: 427 R Axis:   30 Text Interpretation:  Sinus rhythm Probable left ventricular hypertrophy  No significant change since last tracing Confirmed by Markhi Kleckner  MD, Brayln Duque  ([id number]) on 01/15/2015 10:00:04 AM      MDM   Final diagnoses:  None     Diana Allison is a 81 y.o. female here with dizziness. No actual syncope. Consider dementia vs near syncope vs electrolyte imbalance. No signs of infection.   11:58 AM Labs, CT ab/pel, EKG unremarkable. Not orthostatic but given IVF. Eating and drinking normally. Will dc home.   Richardean Canal, MD 01/15/15 1159

## 2015-01-31 ENCOUNTER — Other Ambulatory Visit: Payer: Self-pay | Admitting: Family

## 2015-02-28 ENCOUNTER — Other Ambulatory Visit: Payer: Self-pay | Admitting: Family

## 2015-03-26 ENCOUNTER — Other Ambulatory Visit: Payer: Self-pay | Admitting: Family

## 2015-03-30 ENCOUNTER — Telehealth: Payer: Self-pay

## 2015-03-30 NOTE — Telephone Encounter (Signed)
Pt dtr reporting that pt has been taking abx for the last 4 days and is still experiencing UTI sx of dysuria.   Pt currently has dementia and it is difficult to collect a sample.    Spoke to PCP and was advised to have pt come in for an appt so we can collect and culture to find the best course of action.   Appt made for pt to come into Saturday clinic to have a UA done.

## 2015-03-31 ENCOUNTER — Encounter: Payer: Self-pay | Admitting: Family Medicine

## 2015-03-31 ENCOUNTER — Ambulatory Visit (INDEPENDENT_AMBULATORY_CARE_PROVIDER_SITE_OTHER): Payer: Medicare Other | Admitting: Family Medicine

## 2015-03-31 VITALS — BP 150/104 | HR 67 | Temp 97.6°F | Wt 130.0 lb

## 2015-03-31 DIAGNOSIS — N3000 Acute cystitis without hematuria: Secondary | ICD-10-CM

## 2015-03-31 MED ORDER — SULFAMETHOXAZOLE-TRIMETHOPRIM 800-160 MG PO TABS
1.0000 | ORAL_TABLET | Freq: Two times a day (BID) | ORAL | Status: DC
Start: 2015-03-31 — End: 2015-04-16

## 2015-03-31 NOTE — Patient Instructions (Signed)
Based on symptoms I suspect a uti  Encourage water and other fluids  Always wipe front to back Give the bactrim as directed Follow up if worse or not improving

## 2015-03-31 NOTE — Progress Notes (Signed)
Subjective:    Patient ID: Diana Allison, female    DOB: Oct 09, 1932, 81 y.o.   MRN: [id number]  HPI Here with urinary symptoms (per the daycare she goes to)   Behavior has changes More confused/agitated and combative  Has frequent urination baseline - hard to tell  Does not communicate symptoms   Is incontinent of urine -normal for her now / worse lately  No urine smell noted   Appetite is fair She is loosing weight anyway  No vomiting reported   Last uti was several months ago  (saw Dr Tiburcio Pea for that at [facility])    Goes to an adult day care She is not always cleaned as well as they would like   Recent amox for uri- just finished for bronchitis   Patient Active Problem List   Diagnosis Date Noted  . Severe dementia 04/24/2014  . Dementia 03/22/2014  . GERD (gastroesophageal reflux disease) 03/22/2014  . Unspecified essential hypertension 03/22/2014  . Pure hypercholesterolemia 03/22/2014   Past Medical History  Diagnosis Date  . Hyperlipidemia   . Hypertension   . Cataract   . Dementia    Past Surgical History  Procedure Laterality Date  . Knee surgery    . Vaginal hysterectomy     Social History  Substance Use Topics  . Smoking status: Never Smoker   . Smokeless tobacco: Never Used  . Alcohol Use: No   No family history on file. No Known Allergies Current Outpatient Prescriptions on File Prior to Visit  Medication Sig Dispense Refill  . donepezil (ARICEPT) 10 MG tablet Take 10 mg by mouth daily.  0  . esomeprazole (NEXIUM) 40 MG capsule Take 1 capsule (40 mg total) by mouth daily before breakfast. 30 capsule 4  . ezetimibe (ZETIA) 10 MG tablet Take 10 mg by mouth daily.    . hydrochlorothiazide (MICROZIDE) 12.5 MG capsule TAKE 1 CAPSULE (12.5 MG TOTAL) BY MOUTH DAILY. 90 capsule 0  . hydrocortisone (ANUSOL-HC) 25 MG suppository Place 1 suppository (25 mg total) rectally 2 (two) times daily. 10 suppository 0  . LORazepam (ATIVAN) 0.5 MG tablet TAKE 1  TABLET BY MOUTH TWICE A DAY 60 tablet 0  . losartan (COZAAR) 100 MG tablet Take 1 tablet (100 mg total) by mouth daily. 30 tablet 3  . polyethylene glycol powder (GLYCOLAX/MIRALAX) powder TAKE 17 GRAMS MIXED IN LIQUID BY MOUTH DAILY. 527 g 1  . pramoxine-hydrocortisone (EPIFOAM) 1-1 % foam Apply topically 3 (three) times daily. 10 g 0  . ranitidine (ZANTAC) 300 MG capsule Take 300 mg by mouth 2 (two) times daily as needed for heartburn.   6  . rosuvastatin (CRESTOR) 10 MG tablet Take 10 mg by mouth daily.    . traZODone (DESYREL) 50 MG tablet TAKE 1 TABLET BY MOUTH AT BEDTIME **PATIENT NEEDS APPOINTMENT** 30 tablet 0   No current facility-administered medications on file prior to visit.      Review of Systems Review of Systems  Constitutional: Negative for fever, appetite change, fatigue and unexpected weight change.  Eyes: Negative for pain and visual disturbance.  Respiratory: Negative for cough and shortness of breath.   Cardiovascular: Negative for cp or palpitations    Gastrointestinal: Negative for nausea, diarrhea and constipation.  Genitourinary: pos  for urgency and frequency. neg for hematuria or c/o back pain  Skin: Negative for pallor or rash   Neurological: Negative for weakness, light-headedness, numbness and headaches.  Hematological: Negative for adenopathy. Does not bruise/bleed easily.  Psychiatric/Behavioral: pos for dementia with more agitation lately        Objective:   Physical Exam  Constitutional: She appears well-developed and well-nourished. No distress.  Frail appearing elderly female with dementia   HENT:  Head: Normocephalic and atraumatic.  Eyes: Conjunctivae and EOM are normal. Pupils are equal, round, and reactive to light. No scleral icterus.  Neck: Normal range of motion. Neck supple.  Cardiovascular: Normal rate and regular rhythm.   Pulmonary/Chest: Effort normal and breath sounds normal.  Abdominal: Soft. Bowel sounds are normal. She exhibits  no distension. There is no tenderness. There is no rebound.  No cva tenderness   No suprapubic tenderness or fullness    Musculoskeletal: She exhibits no edema.  Lymphadenopathy:    She has no cervical adenopathy.  Skin: Skin is warm and dry. No rash noted.  Psychiatric:  Calm Dementia - answers a few questions  Not agitated at visit           Assessment & Plan:   Problem List Items Addressed This Visit      Genitourinary   Acute cystitis - Primary    Clinical diagnosis  Could not give a specimen today (missed the urine collector)  Will tx based on symptoms and history from family (pt with dementia) Enc fluids Cover with bactrim ds bid for 5 d   Update if not starting to improve in a week or if worsening

## 2015-03-31 NOTE — Progress Notes (Signed)
Pre visit review using our clinic review tool, if applicable. No additional management support is needed unless otherwise documented below in the visit note. 

## 2015-03-31 NOTE — Assessment & Plan Note (Addendum)
Clinical diagnosis  Could not give a specimen today (missed the urine collector)  Will tx based on symptoms and history from family (pt with dementia) Enc fluids Cover with bactrim ds bid for 5 d   Update if not starting to improve in a week or if worsening

## 2015-04-13 ENCOUNTER — Encounter (HOSPITAL_COMMUNITY): Payer: Self-pay | Admitting: Emergency Medicine

## 2015-04-13 ENCOUNTER — Emergency Department (HOSPITAL_COMMUNITY): Payer: Medicare Other

## 2015-04-13 ENCOUNTER — Observation Stay (HOSPITAL_COMMUNITY)
Admission: EM | Admit: 2015-04-13 | Discharge: 2015-04-16 | Disposition: A | Discharge status: Home / Self Care | Payer: Medicare Other | Attending: Internal Medicine | Admitting: Internal Medicine

## 2015-04-13 DIAGNOSIS — E162 Hypoglycemia, unspecified: Secondary | ICD-10-CM | POA: Insufficient documentation

## 2015-04-13 DIAGNOSIS — I10 Essential (primary) hypertension: Secondary | ICD-10-CM | POA: Diagnosis not present

## 2015-04-13 DIAGNOSIS — Z7982 Long term (current) use of aspirin: Secondary | ICD-10-CM | POA: Insufficient documentation

## 2015-04-13 DIAGNOSIS — N179 Acute kidney failure, unspecified: Secondary | ICD-10-CM | POA: Diagnosis present

## 2015-04-13 DIAGNOSIS — Z79899 Other long term (current) drug therapy: Secondary | ICD-10-CM | POA: Insufficient documentation

## 2015-04-13 DIAGNOSIS — E785 Hyperlipidemia, unspecified: Secondary | ICD-10-CM | POA: Insufficient documentation

## 2015-04-13 DIAGNOSIS — F0280 Dementia in other diseases classified elsewhere without behavioral disturbance: Secondary | ICD-10-CM | POA: Diagnosis not present

## 2015-04-13 DIAGNOSIS — E7800 Pure hypercholesterolemia, unspecified: Secondary | ICD-10-CM

## 2015-04-13 DIAGNOSIS — I517 Cardiomegaly: Secondary | ICD-10-CM | POA: Diagnosis not present

## 2015-04-13 DIAGNOSIS — E876 Hypokalemia: Secondary | ICD-10-CM | POA: Diagnosis present

## 2015-04-13 DIAGNOSIS — I951 Orthostatic hypotension: Principal | ICD-10-CM | POA: Diagnosis present

## 2015-04-13 DIAGNOSIS — F0150 Vascular dementia without behavioral disturbance: Secondary | ICD-10-CM | POA: Diagnosis present

## 2015-04-13 DIAGNOSIS — R55 Syncope and collapse: Secondary | ICD-10-CM | POA: Diagnosis present

## 2015-04-13 DIAGNOSIS — G309 Alzheimer's disease, unspecified: Secondary | ICD-10-CM | POA: Diagnosis not present

## 2015-04-13 DIAGNOSIS — R9401 Abnormal electroencephalogram [EEG]: Secondary | ICD-10-CM | POA: Insufficient documentation

## 2015-04-13 LAB — MAGNESIUM: MAGNESIUM: 2 mg/dL (ref 1.7–2.4)

## 2015-04-13 LAB — URINALYSIS, ROUTINE W REFLEX MICROSCOPIC
Bilirubin Urine: NEGATIVE
GLUCOSE, UA: NEGATIVE mg/dL
Hgb urine dipstick: NEGATIVE
Ketones, ur: NEGATIVE mg/dL
Leukocytes, UA: NEGATIVE
Nitrite: NEGATIVE
Protein, ur: NEGATIVE mg/dL
Specific Gravity, Urine: 1.008 (ref 1.005–1.030)
Urobilinogen, UA: 0.2 mg/dL (ref 0.0–1.0)
pH: 7.5 (ref 5.0–8.0)

## 2015-04-13 LAB — COMPREHENSIVE METABOLIC PANEL
ALT: 17 U/L (ref 14–54)
AST: 21 U/L (ref 15–41)
Albumin: 3.6 g/dL (ref 3.5–5.0)
Alkaline Phosphatase: 55 U/L (ref 38–126)
Anion gap: 9 (ref 5–15)
BUN: 28 mg/dL — AB (ref 6–20)
CHLORIDE: 104 mmol/L (ref 101–111)
CO2: 29 mmol/L (ref 22–32)
CREATININE: 1.27 mg/dL — AB (ref 0.44–1.00)
Calcium: 9.4 mg/dL (ref 8.9–10.3)
GFR calc Af Amer: 45 mL/min — ABNORMAL LOW (ref >60)
GFR calc non Af Amer: 39 mL/min — ABNORMAL LOW (ref >60)
Glucose, Bld: 96 mg/dL (ref 65–99)
Potassium: 4.4 mmol/L (ref 3.5–5.1)
Sodium: 142 mmol/L (ref 135–145)
Total Bilirubin: 0.5 mg/dL (ref 0.3–1.2)
Total Protein: 6.3 g/dL — ABNORMAL LOW (ref 6.5–8.1)

## 2015-04-13 LAB — CBC WITH DIFFERENTIAL/PLATELET
Basophils Absolute: 0 10*3/uL (ref 0.0–0.1)
Basophils Relative: 0 %
EOS PCT: 2 %
Eosinophils Absolute: 0.1 10*3/uL (ref 0.0–0.7)
HCT: 35.8 % — ABNORMAL LOW (ref 36.0–46.0)
HEMOGLOBIN: 11.7 g/dL — AB (ref 12.0–15.0)
LYMPHS PCT: 62 %
Lymphs Abs: 2.3 10*3/uL (ref 0.7–4.0)
MCH: 26.9 pg (ref 26.0–34.0)
MCHC: 32.7 g/dL (ref 30.0–36.0)
MCV: 82.3 fL (ref 78.0–100.0)
MONOS PCT: 9 %
Monocytes Absolute: 0.3 10*3/uL (ref 0.1–1.0)
NEUTROS PCT: 27 %
Neutro Abs: 1 10*3/uL — ABNORMAL LOW (ref 1.7–7.7)
PLATELETS: 177 10*3/uL (ref 150–400)
RBC: 4.35 MIL/uL (ref 3.87–5.11)
RDW: 13.8 % (ref 11.5–15.5)
WBC: 3.7 10*3/uL — AB (ref 4.0–10.5)

## 2015-04-13 LAB — I-STAT CG4 LACTIC ACID, ED: Lactic Acid, Venous: 1.11 mmol/L (ref 0.5–2.0)

## 2015-04-13 LAB — I-STAT TROPONIN, ED: Troponin i, poc: 0 ng/mL (ref 0.00–0.08)

## 2015-04-13 LAB — CBG MONITORING, ED: Glucose-Capillary: 90 mg/dL (ref 65–99)

## 2015-04-13 LAB — TROPONIN I: Troponin I: <0.03 ng/mL (ref <0.031)

## 2015-04-13 LAB — GLUCOSE, CAPILLARY: GLUCOSE-CAPILLARY: 102 mg/dL — AB (ref 65–99)

## 2015-04-13 MED ORDER — SODIUM CHLORIDE 0.9 % IV BOLUS (SEPSIS)
500.0000 mL | Freq: Once | INTRAVENOUS | Status: AC
  Administered 2015-04-13: 500 mL via INTRAVENOUS

## 2015-04-13 MED ORDER — HYDROMORPHONE HCL 1 MG/ML IJ SOLN: 0.5000 mg | INTRAMUSCULAR | Status: DC | PRN

## 2015-04-13 MED ORDER — SODIUM CHLORIDE 0.9 % IV SOLN
INTRAVENOUS | Status: DC
  Administered 2015-04-13: 23:00:00 via INTRAVENOUS

## 2015-04-13 MED ORDER — HALOPERIDOL LACTATE 5 MG/ML IJ SOLN
2.0000 mg | Freq: Once | INTRAMUSCULAR | Status: AC
  Administered 2015-04-13: 2 mg via INTRAVENOUS
  Filled 2015-04-13: qty 1

## 2015-04-13 MED ORDER — ADULT MULTIVITAMIN W/MINERALS CH
1.0000 | ORAL_TABLET | Freq: Every day | ORAL | Status: DC
  Administered 2015-04-14 – 2015-04-16 (×3): 1 via ORAL
  Filled 2015-04-13 (×4): qty 1

## 2015-04-13 MED ORDER — ACETAMINOPHEN 325 MG PO TABS: 650.0000 mg | ORAL_TABLET | Freq: Four times a day (QID) | ORAL | Status: DC | PRN

## 2015-04-13 MED ORDER — ONDANSETRON HCL 4 MG/2ML IJ SOLN: 4.0000 mg | Freq: Four times a day (QID) | INTRAMUSCULAR | Status: DC | PRN

## 2015-04-13 MED ORDER — ACETAMINOPHEN 650 MG RE SUPP
650.0000 mg | Freq: Four times a day (QID) | RECTAL | Status: DC | PRN
Start: 2015-04-13 — End: 2015-04-16

## 2015-04-13 MED ORDER — DONEPEZIL HCL 10 MG PO TABS
10.0000 mg | ORAL_TABLET | Freq: Every day | ORAL | Status: DC
  Administered 2015-04-15 (×2): 10 mg via ORAL
  Filled 2015-04-13 (×4): qty 1

## 2015-04-13 MED ORDER — LORAZEPAM 0.5 MG PO TABS
0.5000 mg | ORAL_TABLET | Freq: Two times a day (BID) | ORAL | Status: DC
  Administered 2015-04-14 – 2015-04-16 (×3): 0.5 mg via ORAL
  Filled 2015-04-13 (×7): qty 1

## 2015-04-13 MED ORDER — TRAZODONE HCL 50 MG PO TABS
50.0000 mg | ORAL_TABLET | Freq: Every day | ORAL | Status: DC
  Administered 2015-04-14 – 2015-04-15 (×2): 50 mg via ORAL
  Filled 2015-04-13 (×3): qty 1

## 2015-04-13 MED ORDER — SODIUM CHLORIDE 0.9 % IJ SOLN
3.0000 mL | Freq: Two times a day (BID) | INTRAMUSCULAR | Status: DC
  Administered 2015-04-14 – 2015-04-16 (×5): 3 mL via INTRAVENOUS

## 2015-04-13 MED ORDER — ONDANSETRON HCL 4 MG PO TABS: 4.0000 mg | ORAL_TABLET | Freq: Four times a day (QID) | ORAL | Status: DC | PRN

## 2015-04-13 MED ORDER — OXYCODONE HCL 5 MG PO TABS: 5.0000 mg | ORAL_TABLET | ORAL | Status: DC | PRN

## 2015-04-13 MED ORDER — SODIUM CHLORIDE 0.9 % IV BOLUS (SEPSIS)
500.0000 mL | Freq: Once | INTRAVENOUS | Status: AC
Start: 2015-04-13 — End: 2015-04-13
  Administered 2015-04-13: 500 mL via INTRAVENOUS

## 2015-04-13 MED ORDER — HYDRALAZINE HCL 20 MG/ML IJ SOLN
5.0000 mg | Freq: Four times a day (QID) | INTRAMUSCULAR | Status: DC | PRN
  Administered 2015-04-13: 5 mg via INTRAVENOUS
  Filled 2015-04-13: qty 1

## 2015-04-13 MED ORDER — ASPIRIN EC 81 MG PO TBEC
81.0000 mg | DELAYED_RELEASE_TABLET | Freq: Every day | ORAL | Status: DC
  Administered 2015-04-14 – 2015-04-16 (×3): 81 mg via ORAL
  Filled 2015-04-13 (×4): qty 1

## 2015-04-13 MED ORDER — ENOXAPARIN SODIUM 30 MG/0.3ML ~~LOC~~ SOLN
30.0000 mg | Freq: Every day | SUBCUTANEOUS | Status: DC
  Administered 2015-04-13: 30 mg via SUBCUTANEOUS
  Filled 2015-04-13: qty 0.3

## 2015-04-13 MED ORDER — ALUM & MAG HYDROXIDE-SIMETH 200-200-20 MG/5ML PO SUSP: 30.0000 mL | Freq: Four times a day (QID) | ORAL | Status: DC | PRN

## 2015-04-13 MED ORDER — EZETIMIBE 10 MG PO TABS
10.0000 mg | ORAL_TABLET | Freq: Every day | ORAL | Status: DC
  Administered 2015-04-15 (×2): 10 mg via ORAL
  Filled 2015-04-13 (×4): qty 1

## 2015-04-13 MED ORDER — FAMOTIDINE 20 MG PO TABS
20.0000 mg | ORAL_TABLET | Freq: Two times a day (BID) | ORAL | Status: DC
  Administered 2015-04-14 – 2015-04-16 (×5): 20 mg via ORAL
  Filled 2015-04-13 (×8): qty 1

## 2015-04-13 NOTE — Progress Notes (Signed)
Pt arrived from ED by stretcher. Pt has hx of alzheimer's.  Pt is oriented to self only and is calm now.  No current orders for restraints and does not need them.  Bed alarm on and room near nurse's station.  Will continue to monitor pt. Harriet Massonavidson, Kimie Pidcock E

## 2015-04-13 NOTE — ED Provider Notes (Signed)
CSN: [id number]     Arrival date & time 04/13/15  1633 History   First MD Initiated Contact with Patient 04/13/15 1656     Chief Complaint  Patient presents with  . Loss of Consciousness     (Consider location/radiation/quality/duration/timing/severity/associated sxs/prior Treatment) Patient is a 81 y.o. female presenting with syncope. The history is provided by the EMS personnel. The history is limited by the condition of the patient.  Loss of Consciousness Episode history:  Multiple Most recent episode:  Today Timing:  Intermittent Progression:  Unchanged Chronicity:  New Context: inactivity, medication change, normal activity, sight of blood, sitting down and standing up   Context: not blood draw and not urination   Witnessed: yes   Relieved by:  None tried Worsened by:  Nothing tried Ineffective treatments:  None tried Associated symptoms: confusion   Associated symptoms: no anxiety, no chest pain, no diaphoresis, no difficulty breathing, no dizziness, no fever, no focal weakness, no headaches, no malaise/fatigue, no nausea, no palpitations, no recent surgery, no shortness of breath, no vomiting and no weakness   Risk factors: no seizures and no vascular disease     Past Medical History  Diagnosis Date  . Hyperlipidemia   . Hypertension   . Cataract   . Dementia    Past Surgical History  Procedure Laterality Date  . Knee surgery    . Vaginal hysterectomy     No family history on file. Social History  Substance Use Topics  . Smoking status: Never Smoker   . Smokeless tobacco: Never Used  . Alcohol Use: No   OB History    No data available     Review of Systems  Constitutional: Negative for fever, malaise/fatigue and diaphoresis.  HENT: Negative for facial swelling.   Respiratory: Negative for shortness of breath.   Cardiovascular: Positive for syncope. Negative for chest pain and palpitations.  Gastrointestinal: Negative for nausea, vomiting and abdominal  pain.  Genitourinary: Negative for dysuria.  Musculoskeletal: Negative for back pain.  Skin: Negative for rash.  Neurological: Negative for dizziness, focal weakness, weakness and headaches.  Psychiatric/Behavioral: Positive for confusion.      Allergies  Review of patient's allergies indicates no known allergies.  Home Medications   Prior to Admission medications   Medication Sig Start Date End Date Taking? Authorizing Provider  aspirin EC 81 MG tablet Take 81 mg by mouth daily.   Yes Historical Provider, MD  donepezil (ARICEPT) 10 MG tablet Take 10 mg by mouth at bedtime.  06/30/14  Yes Historical Provider, MD  ezetimibe (ZETIA) 10 MG tablet Take 10 mg by mouth at bedtime.    Yes Historical Provider, MD  hydrochlorothiazide (MICROZIDE) 12.5 MG capsule TAKE 1 CAPSULE (12.5 MG TOTAL) BY MOUTH DAILY. Patient taking differently: TAKE 1 CAPSULE (12.5 MG TOTAL) BY MOUTH DAILY AT BEDTIME 11/07/14  Yes Eulis FosterPadonda B Webb, FNP  LORazepam (ATIVAN) 0.5 MG tablet TAKE 1 TABLET BY MOUTH TWICE A DAY Patient taking differently: TAKE 1 TABLET BY MOUTH TWICE A DAY AS NEEDED FOR ANXIETY 12/13/14  Yes Eulis FosterPadonda B Webb, FNP  losartan (COZAAR) 100 MG tablet Take 1 tablet (100 mg total) by mouth daily. Patient taking differently: Take 100 mg by mouth at bedtime.  09/19/14  Yes Eulis FosterPadonda B Webb, FNP  Multiple Vitamin (MULTIVITAMIN WITH MINERALS) TABS tablet Take 1 tablet by mouth daily.   Yes Historical Provider, MD  polyethylene glycol powder (GLYCOLAX/MIRALAX) powder TAKE 17 GRAMS MIXED IN LIQUID BY MOUTH DAILY. 01/02/15  Yes  [name], FNP  ranitidine (ZANTAC) 300 MG capsule Take 300 mg by mouth 2 (two) times daily as needed for heartburn.  01/01/15  Yes Historical Provider, MD  traZODone (DESYREL) 50 MG tablet TAKE 1 TABLET BY MOUTH AT BEDTIME **PATIENT NEEDS APPOINTMENT** 03/26/15  Yes [name], FNP  esomeprazole (NEXIUM) 40 MG capsule Take 1 capsule (40 mg total) by mouth daily before breakfast. Patient  not taking: Reported on 04/13/2015 03/22/14   [name], FNP  hydrocortisone (ANUSOL-HC) 25 MG suppository Place 1 suppository (25 mg total) rectally 2 (two) times daily. Patient not taking: Reported on 04/13/2015 04/10/14   [name], FNP  pramoxine-hydrocortisone (EPIFOAM) 1-1 % foam Apply topically 3 (three) times daily. Patient not taking: Reported on 04/13/2015 05/31/14   [name], FNP  sulfamethoxazole-trimethoprim (BACTRIM DS,SEPTRA DS) 800-160 MG tablet Take 1 tablet by mouth 2 (two) times daily. Patient not taking: Reported on 04/13/2015 03/31/15   Audrie Gallus Tower, MD   BP 178/97 mmHg  Pulse 66  Temp(Src) 98.6 F (37 C) (Oral)  Resp 21  SpO2 99% Physical Exam  Constitutional: She appears well-developed and well-nourished. No distress.  HENT:  Head: Normocephalic and atraumatic.  Right Ear: External ear normal.  Left Ear: External ear normal.  Nose: Nose normal.  Mouth/Throat: Oropharynx is clear and moist. No oropharyngeal exudate.  Eyes: Conjunctivae and EOM are normal. Pupils are equal, round, and reactive to light. Right eye exhibits no discharge. Left eye exhibits no discharge. No scleral icterus.  Neck: Normal range of motion. Neck supple. No JVD present. No tracheal deviation present. No thyromegaly present.  Cardiovascular: Normal rate, regular rhythm and intact distal pulses.   Pulmonary/Chest: Effort normal and breath sounds normal. No stridor. No respiratory distress. She has no wheezes. She has no rales. She exhibits no tenderness.  Abdominal: Soft. She exhibits no distension.  Musculoskeletal: Normal range of motion. She exhibits no edema or tenderness.  Lymphadenopathy:    She has no cervical adenopathy.  Neurological: She is alert. She displays normal reflexes. No cranial nerve deficit. She exhibits normal muscle tone. Coordination normal. GCS eye subscore is 4. GCS verbal subscore is 5. GCS motor subscore is 6.  Skin: Skin is warm and dry. No rash  noted. She is not diaphoretic. No erythema. No pallor.  Nursing note and vitals reviewed.   ED Course  Procedures (including critical care time) Labs Review Labs Reviewed  CBC WITH DIFFERENTIAL/PLATELET - Abnormal; Notable for the following:    WBC 3.7 (*)    Hemoglobin 11.7 (*)    HCT 35.8 (*)    Neutro Abs 1.0 (*)    All other components within normal limits  COMPREHENSIVE METABOLIC PANEL - Abnormal; Notable for the following:    BUN 28 (*)    Creatinine, Ser 1.27 (*)    Total Protein 6.3 (*)    GFR calc non Af Amer 39 (*)    GFR calc Af Amer 45 (*)    All other components within normal limits  GLUCOSE, CAPILLARY - Abnormal; Notable for the following:    Glucose-Capillary 102 (*)    All other components within normal limits  URINALYSIS, ROUTINE W REFLEX MICROSCOPIC (NOT AT [facility])  TROPONIN I  MAGNESIUM  BASIC METABOLIC PANEL  CBC  TROPONIN I  TROPONIN I  LIPID PANEL  CBG MONITORING, ED  I-STAT TROPOININ, ED  I-STAT CG4 LACTIC ACID, ED    Imaging Review Dg Chest 2 View  04/13/2015  CLINICAL DATA:  Syncope EXAM: CHEST  2 VIEW COMPARISON:  03/28/2014 FINDINGS: Lungs are clear.  No pleural effusion or pneumothorax. Cardiomegaly. Degenerative changes of the visualized thoracolumbar spine. IMPRESSION: No evidence of acute cardiopulmonary disease. Electronically Signed   By: Charline Bills M.D.   On: 04/13/2015 17:44   Ct Head Wo Contrast  04/13/2015  CLINICAL DATA:  Syncope. EXAM: CT HEAD WITHOUT CONTRAST TECHNIQUE: Contiguous axial images were obtained from the base of the skull through the vertex without intravenous contrast. COMPARISON:  CT scan of January 15, 2015. FINDINGS: Bony calvarium appears intact. Diffuse cortical atrophy is noted. Chronic ischemic white matter disease is noted. 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 No mass effect or midline shift is noted. Ventricular size is within normal limits. There is no evidence of mass lesion, hemorrhage or acute  infarction. IMPRESSION: Diffuse cortical atrophy. Chronic ischemic white matter disease. 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 No acute intracranial abnormality seen. Electronically Signed   By: Lupita Raider, M.D.   On: 04/13/2015 19:21   I have personally reviewed and evaluated these images and lab results as part of my medical decision-making.   EKG Interpretation   Date/Time:  Friday April 13 2015 16:55:17 EDT Ventricular Rate:  68 PR Interval:  162 QRS Duration: 96 QT Interval:  412 QTC Calculation: 438 R Axis:   11 Text Interpretation:  Sinus rhythm Left ventricular hypertrophy No acute  changes No significant change since last tracing Confirmed by Rhunette Croft,  MD, Janey Genta [phone]) on 04/13/2015 5:34:30 PM      MDM   Final diagnoses:  Syncope and collapse  AKI (acute kidney injury) (HCC)    Patient was at an adult enrichment center today.  She had a brief episode of loss of consciousness. She was sitting in her chair and appeared slumped over. Patient possibly had some shaking activity associated with this. Per report patient's initial blood sugar was 44. Unknown if any treatment was administered. Subsequently patient's blood sugar was rechecked by EMS crew and was 103. At this time patient is otherwise in no acute distress. She is alert but only oriented to self. I contacted patient's daughter who was in route to the hospital and verified patient's mental status is at baseline per her description. Patient has a history of advanced Alzheimer's and has had recent episodes of brief LOC with return to baseline. Patient has recently lost more weight and is having decreased by mouth intake. Daughter states that she is been trying to encourage more oral intake the patient does not seem aware of this or otherwise motivated to increase her oral intake.   Suspect potential failure to thrive in adult based on daughter's description of her mother's changes. Also would consider TIA, hypoglycemia,  electrolyte abnormalities, transient hypotension. Seizure disorder appears less likely as patient does not have a history of this possibly did have some myoclonic jerking associated with loss of consciousness.  No prolonged post-ictal state, and no bowel or bladder incontinence, or tongue biting noted. Also would consider transient arrhythmia. Her We'll obtain laboratory workup to evaluate for any acute electrolyte abnormalities blood glucose here is stable. Patient's neurologic exam is nonfocal this time. Prior imaging studies reviewed which show microvascular disease in the brain with prior remote small vessel infarcts. EMS ECG reviewed and there are no significant acute ST changes to suggest acute ischemic event. We'll obtain ECG here as well. Also chest x-ray and head CT for further evaluation. Also attempted to contact Center for additional information  and was unable to reach anyone on initial attempts.  Lab w/u and imaging unremarkable.   Pt admission in process.    Gavin Pound, MD 04/14/15 1610  Derwood Kaplan, MD 04/16/15 2121

## 2015-04-13 NOTE — H&P (Signed)
[facility] Admission History and Physical       Diana Allison UEA:[id number] DOB: 1932-08-03 DOA: 04/13/2015  Referring physician: EDP PCP: [name], FNP  Specialists:   Chief Complaint: Passed Out  HPI: Diana Allison is a 81 y.o. female with a history of HTN, Hyperlipidemia and Severe Dementia who was at her [facility] during the day and was witnessed as having a syncopal episode while sitting in a chair.   She had LOC for 1 -2 minutes, and was reported as confused when she came around.  Her daughter reports that she has had previous similar episodes 1.5 months ago.   EMS was called and reported that her glucose was 44.   In the ED, her glucose level was 96.   A head ct was performed and was negative for acute findings.  She was referred for further workup.   Review of Systems: Unable to Obtain from the Patient  Past Medical History  Diagnosis Date  . Hyperlipidemia   . Hypertension   . Cataract   . Dementia      Past Surgical History  Procedure Laterality Date  . Knee surgery    . Vaginal hysterectomy        Prior to Admission medications   Medication Sig Start Date End Date Taking? Authorizing Provider  aspirin EC 81 MG tablet Take 81 mg by mouth daily.   Yes Historical Provider, MD  donepezil (ARICEPT) 10 MG tablet Take 10 mg by mouth at bedtime.  06/30/14  Yes Historical Provider, MD  ezetimibe (ZETIA) 10 MG tablet Take 10 mg by mouth at bedtime.    Yes Historical Provider, MD  hydrochlorothiazide (MICROZIDE) 12.5 MG capsule TAKE 1 CAPSULE (12.5 MG TOTAL) BY MOUTH DAILY. Patient taking differently: TAKE 1 CAPSULE (12.5 MG TOTAL) BY MOUTH DAILY AT BEDTIME 11/07/14  Yes [name], FNP  LORazepam (ATIVAN) 0.5 MG tablet TAKE 1 TABLET BY MOUTH TWICE A DAY Patient taking differently: TAKE 1 TABLET BY MOUTH TWICE A DAY AS NEEDED FOR ANXIETY 12/13/14  Yes [name], FNP  losartan (COZAAR) 100 MG tablet Take 1 tablet (100 mg total) by  mouth daily. Patient taking differently: Take 100 mg by mouth at bedtime.  09/19/14  Yes [name], FNP  Multiple Vitamin (MULTIVITAMIN WITH MINERALS) TABS tablet Take 1 tablet by mouth daily.   Yes Historical Provider, MD  polyethylene glycol powder (GLYCOLAX/MIRALAX) powder TAKE 17 GRAMS MIXED IN LIQUID BY MOUTH DAILY. 01/02/15  Yes [name], FNP  ranitidine (ZANTAC) 300 MG capsule Take 300 mg by mouth 2 (two) times daily as needed for heartburn.  01/01/15  Yes Historical Provider, MD  traZODone (DESYREL) 50 MG tablet TAKE 1 TABLET BY MOUTH AT BEDTIME **PATIENT NEEDS APPOINTMENT** 03/26/15  Yes [name], FNP  esomeprazole (NEXIUM) 40 MG capsule Take 1 capsule (40 mg total) by mouth daily before breakfast. Patient not taking: Reported on 04/13/2015 03/22/14   [name], FNP  hydrocortisone (ANUSOL-HC) 25 MG suppository Place 1 suppository (25 mg total) rectally 2 (two) times daily. Patient not taking: Reported on 04/13/2015 04/10/14   [name], FNP  pramoxine-hydrocortisone (EPIFOAM) 1-1 % foam Apply topically 3 (three) times daily. Patient not taking: Reported on 04/13/2015 05/31/14   [name], FNP  sulfamethoxazole-trimethoprim (BACTRIM DS,SEPTRA DS) 800-160 MG tablet Take 1 tablet by mouth 2 (two) times daily. Patient not taking: Reported on 04/13/2015 03/31/15   Judy Pimple, MD  No Known Allergies  Social History:  reports that she has never smoked. She has never used smokeless tobacco. She reports that she does not drink alcohol or use illicit drugs.    No family history on file.     Physical Exam:  GEN:  Pleasant and Confused Elderly Cachectic 81 y.o. African American female examined and in no acute distress; cooperative with exam Filed Vitals:   04/13/15 1919 04/13/15 1930 04/13/15 2120 04/13/15 2130  BP: 179/95 173/82 176/93 168/91  Pulse: 77 74 74 73  Temp:      TempSrc:      Resp:  16 16 17   SpO2: 95% 94% 99% 94%   Blood pressure 168/91,  pulse 73, temperature 98.6 F (37 C), temperature source Oral, resp. rate 17, SpO2 94 %. PSYCH: She is alert and oriented x 0; does not appear anxious does not appear depressed; affect is normal HEENT: Normocephalic and Atraumatic, Mucous membranes pink; PERRLA; EOM intact; Fundi:  Benign;  No scleral icterus, Nares: Patent, Oropharynx: Unable to Visualize due to lack of Cooperation,      Neck:  FROM, No Cervical Lymphadenopathy nor Thyromegaly or Carotid Bruit; No JVD; Breasts:: Not examined CHEST WALL: No tenderness CHEST: Normal respiration, clear to auscultation bilaterally HEART: Regular rate and rhythm; no murmurs rubs or gallops BACK: No kyphosis or scoliosis; No CVA tenderness ABDOMEN: Positive Bowel Sounds, Scaphoid, Soft Non-Tender, No Rebound or Guarding; No Masses, No Organomegaly. Rectal Exam: Not done EXTREMITIES: No Cyanosis, Clubbing, or Edema; No Ulcerations. Genitalia: not examined PULSES: 2+ and symmetric SKIN: Normal hydration no rash or ulceration CNS:  Alert and Oriented x  0, No Focal Deficits Vascular: pulses palpable throughout    Labs on Admission:  Basic Metabolic Panel:  Recent Labs Lab 04/13/15 1715  NA 142  K 4.4  CL 104  CO2 29  GLUCOSE 96  BUN 28*  CREATININE 1.27*  CALCIUM 9.4   Liver Function Tests:  Recent Labs Lab 04/13/15 1715  AST 21  ALT 17  ALKPHOS 55  BILITOT 0.5  PROT 6.3*  ALBUMIN 3.6   No results for input(s): LIPASE, AMYLASE in the last 168 hours. No results for input(s): AMMONIA in the last 168 hours. CBC:  Recent Labs Lab 04/13/15 1715  WBC 3.7*  NEUTROABS 1.0*  HGB 11.7*  HCT 35.8*  MCV 82.3  PLT 177   Cardiac Enzymes: No results for input(s): CKTOTAL, CKMB, CKMBINDEX, TROPONINI in the last 168 hours.  BNP (last 3 results) No results for input(s): BNP in the last 8760 hours.  ProBNP (last 3 results) No results for input(s): PROBNP in the last 8760 hours.  CBG:  Recent Labs Lab 04/13/15 1714    GLUCAP 90    Radiological Exams on Admission: Dg Chest 2 View  04/13/2015  CLINICAL DATA:  Syncope EXAM: CHEST  2 VIEW COMPARISON:  03/28/2014 FINDINGS: Lungs are clear.  No pleural effusion or pneumothorax. Cardiomegaly. Degenerative changes of the visualized thoracolumbar spine. IMPRESSION: No evidence of acute cardiopulmonary disease. Electronically Signed   By: Charline BillsSriyesh  Krishnan M.D.   On: 04/13/2015 17:44   Ct Head Wo Contrast  04/13/2015  CLINICAL DATA:  Syncope. EXAM: CT HEAD WITHOUT CONTRAST TECHNIQUE: Contiguous axial images were obtained from the base of the skull through the vertex without intravenous contrast. COMPARISON:  CT scan of January 15, 2015. FINDINGS: Bony calvarium appears intact. Diffuse cortical atrophy is noted. Chronic ischemic white matter disease is noted. 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 No  mass effect or midline shift is noted. Ventricular size is within normal limits. There is no evidence of mass lesion, hemorrhage or acute infarction. IMPRESSION: Diffuse cortical atrophy. Chronic ischemic white matter disease. 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 No acute intracranial abnormality seen. Electronically Signed   By: Lupita Raider, M.D.   On: 04/13/2015 19:21     EKG: Independently reviewed. Normal sinus Rhythm Rate = 68 +LVH changes    Assessment/Plan:   81 y.o. female with    1.     Syncope   Syncope workup   Cardiac Monitoring   Neuro Checks   Cycle Troponins   Check Orthostatics   2D ECHO and EEG in AM   Monitor O2 sats and Glucose levels      Active Problems:   2. AKI- Previous Cr = 1.0   Hold HCTZ and Losartan Rx   Monitor BUN/Cr   IVFs for Rehydration     3.    Essential hypertension   Hold HCTZ and Losartan RX due to AKI   PRN IV Hydralazine   Monitor BPs     4.    Pure hypercholesterolemia   Check Fasting Lipids     5.    Severe dementia   Coninue Aricept     6.    DVT Prophylaxis   Lovenox     Code Status:     FULL  CODE Family Communication:   Family at Bedside       Disposition Plan:    Observation Status with Anticipated discharge in 1-2 days back to Home  Time spent:   55 Minutes      Ron Parker [facility] Pager [phone]   If 7AM -7PM Please Contact the Day Rounding Team MD for [facility]  If 7PM-7AM, Please Contact Night-Floor Coverage  [email or web address] Password [email or web address] 04/13/2015, 9:36 PM     ADDENDUM:   Patient was seen and examined on 04/13/2015

## 2015-04-13 NOTE — Progress Notes (Addendum)
After family left, pt became unresponsive and would not open her eyes. It seemed like pt was having small tremors and pupils were unequal with the left one much smaller than the right.  Rapid response nurse was informed and came to assess the pt.  When pt was left alone she would open her eyes and move around.  Will continue to monitor the pt. Harriet Massonavidson, Tesia Lybrand E

## 2015-04-13 NOTE — ED Notes (Signed)
[facility]. Ems reports they were called out for what appeared to be a syncopal episode while the patient was sitting in a chair. EMS advised that patient appeared post ictal upon their arrival and stated patient was awake and talking to them when they got her to their truck. Pt has hx of alzheimers.

## 2015-04-14 ENCOUNTER — Observation Stay (HOSPITAL_BASED_OUTPATIENT_CLINIC_OR_DEPARTMENT_OTHER): Payer: Medicare Other

## 2015-04-14 DIAGNOSIS — I951 Orthostatic hypotension: Secondary | ICD-10-CM | POA: Diagnosis not present

## 2015-04-14 DIAGNOSIS — R55 Syncope and collapse: Secondary | ICD-10-CM

## 2015-04-14 DIAGNOSIS — E876 Hypokalemia: Secondary | ICD-10-CM

## 2015-04-14 DIAGNOSIS — F0390 Unspecified dementia without behavioral disturbance: Secondary | ICD-10-CM

## 2015-04-14 LAB — LIPID PANEL
CHOL/HDL RATIO: 2.8 ratio
Cholesterol: 242 mg/dL — ABNORMAL HIGH (ref 0–200)
HDL: 85 mg/dL (ref >40)
LDL CALC: 137 mg/dL — AB (ref 0–99)
Triglycerides: 98 mg/dL (ref <150)
VLDL: 20 mg/dL (ref 0–40)

## 2015-04-14 LAB — GLUCOSE, CAPILLARY
GLUCOSE-CAPILLARY: 125 mg/dL — AB (ref 65–99)
Glucose-Capillary: 158 mg/dL — ABNORMAL HIGH (ref 65–99)
Glucose-Capillary: 99 mg/dL (ref 65–99)
Glucose-Capillary: 110 mg/dL — ABNORMAL HIGH (ref 65–99)
Glucose-Capillary: 129 mg/dL — ABNORMAL HIGH (ref 65–99)

## 2015-04-14 LAB — BASIC METABOLIC PANEL
ANION GAP: 7 (ref 5–15)
BUN: 16 mg/dL (ref 6–20)
CO2: 26 mmol/L (ref 22–32)
Calcium: 8.5 mg/dL — ABNORMAL LOW (ref 8.9–10.3)
Chloride: 104 mmol/L (ref 101–111)
Creatinine, Ser: 0.79 mg/dL (ref 0.44–1.00)
GFR calc Af Amer: >60 mL/min (ref >60)
GFR calc non Af Amer: >60 mL/min (ref >60)
GLUCOSE: 124 mg/dL — AB (ref 65–99)
POTASSIUM: 3.2 mmol/L — AB (ref 3.5–5.1)
Sodium: 137 mmol/L (ref 135–145)

## 2015-04-14 LAB — CBC
HEMATOCRIT: 35.5 % — AB (ref 36.0–46.0)
HEMOGLOBIN: 11.4 g/dL — AB (ref 12.0–15.0)
MCH: 26.6 pg (ref 26.0–34.0)
MCHC: 32.1 g/dL (ref 30.0–36.0)
MCV: 82.8 fL (ref 78.0–100.0)
Platelets: 177 10*3/uL (ref 150–400)
RBC: 4.29 MIL/uL (ref 3.87–5.11)
RDW: 13.9 % (ref 11.5–15.5)
WBC: 3.5 10*3/uL — ABNORMAL LOW (ref 4.0–10.5)

## 2015-04-14 LAB — TROPONIN I
Troponin I: <0.03 ng/mL (ref <0.031)
Troponin I: <0.03 ng/mL (ref <0.031)

## 2015-04-14 MED ORDER — POTASSIUM CHLORIDE CRYS ER 20 MEQ PO TBCR
40.0000 meq | EXTENDED_RELEASE_TABLET | Freq: Once | ORAL | Status: AC
  Administered 2015-04-14: 40 meq via ORAL
  Filled 2015-04-14: qty 2

## 2015-04-14 MED ORDER — ENOXAPARIN SODIUM 40 MG/0.4ML ~~LOC~~ SOLN
40.0000 mg | Freq: Every day | SUBCUTANEOUS | Status: DC
  Administered 2015-04-14 – 2015-04-15 (×2): 40 mg via SUBCUTANEOUS
  Filled 2015-04-14 (×2): qty 0.4

## 2015-04-14 NOTE — Progress Notes (Signed)
  Echocardiogram 2D Echocardiogram has been performed.  Arvil ChacoFoster, Quanell Loughney 04/14/2015, 4:44 PM

## 2015-04-14 NOTE — Progress Notes (Signed)
[facility] PROGRESS NOTE  Diana GageLorraine M Allison GNF:[id number] DOB: February 10, 1933 DOA: 04/13/2015 PCP: Eulis FosterPadonda B Webb, FNP  Brief narrative 81 year old severely demented female with history of hypertension and hyperlipidemia was at her adult enrichment program during the day when she had a syncopal episode while sitting in a chair. She had loss of consciousness for almost 1-2 minutes and when she got up and she was increasingly confused. Her daughter reports that she had similar episode about 2 months back. EMS was called who found her blood glucose to be 44. Her blood glucose in the ED was 96. Vitals in the ED was unremarkable. Blood work done showed mild acute kidney injury. Head CT was done which was unremarkable. Patient was admitted for further workup.  Assessment/Plan: Syncope Monitor on telemetry. Check orthostasis. Order 2-D echo and EEG. Patient reportedly having low blood glucose per EMS. She is not diabetic. Will monitor fingersticks closely. Continue gentle hydration.  Hypoglycemia No clear etiology. Not on any medication that could potentially cause hypoglycemia. Would monitor closely.  Acute kidney injury Holding HCTZ and losartan. Improving with gentle hydration.  Hypokalemia Replenish  Essential hypertension Blood pressure stable. Holding home blood pressure medications given acute kidney injury.  Severe dementia As per daughter she is at baseline. Continue Aricept and trazodone.  DVT prophylaxis: Subcutaneous Lovenox Diet: Heart healthy  Code Status: Full code Family Communication: Daughter at bedside  Disposition Plan: Home tomorrow if stable and workup completed.   Consultants:  None  Procedures:  Head CT  Antibiotics: None   HPI/Subjective: Seen and examined. Admission H&P reviewed. Reportedly patient was more confused during the night. Daughter at + who reports her mental status is at baseline (has severe dementia)  Objective: Filed Vitals:   04/14/15 0410  BP: 155/81  Pulse: 81  Temp: 97.8 F (36.6 C)  Resp: 18    Intake/Output Summary (Last 24 hours) at 04/14/15 1052 Last data filed at 04/14/15 0700  Gross per 24 hour  Intake    850 ml  Output      0 ml  Net    850 ml   Filed Weights   04/13/15 2204  Weight: 58.5 kg (128 lb 15.5 oz)    Exam:   General:  Elderly female not in distress  HEENT: No pallor, moist oral mucosa  Chest: Clear to auscultation bilaterally    CVS: Normal S1 and S2, no murmurs  GI: Soft, nondistended, nontender  Musculoskeletal: Warm, no edema  CNS: Awake and alert, not oriented    Data Reviewed: Basic Metabolic Panel:  Recent Labs Lab 04/13/15 1715 04/13/15 2235 04/14/15 0331  NA 142  --  137  K 4.4  --  3.2*  CL 104  --  104  CO2 29  --  26  GLUCOSE 96  --  124*  BUN 28*  --  16  CREATININE 1.27*  --  0.79  CALCIUM 9.4  --  8.5*  MG  --  2.0  --    Liver Function Tests:  Recent Labs Lab 04/13/15 1715  AST 21  ALT 17  ALKPHOS 55  BILITOT 0.5  PROT 6.3*  ALBUMIN 3.6   No results for input(s): LIPASE, AMYLASE in the last 168 hours. No results for input(s): AMMONIA in the last 168 hours. CBC:  Recent Labs Lab 04/13/15 1715 04/14/15 0331  WBC 3.7* 3.5*  NEUTROABS 1.0*  --   HGB 11.7* 11.4*  HCT 35.8* 35.5*  MCV 82.3 82.8  PLT 177 177  Cardiac Enzymes:  Recent Labs Lab 04/13/15 2235 04/14/15 0331  TROPONINI <0.03 <0.03   BNP (last 3 results) No results for input(s): BNP in the last 8760 hours.  ProBNP (last 3 results) No results for input(s): PROBNP in the last 8760 hours.  CBG:  Recent Labs Lab 04/13/15 1714 04/13/15 2217 04/14/15 0406 04/14/15 0810  GLUCAP 90 102* 125* 158*    No results found for this or any previous visit (from the past 240 hour(s)).   Studies: Dg Chest 2 View  04/13/2015  CLINICAL DATA:  Syncope EXAM: CHEST  2 VIEW COMPARISON:  03/28/2014 FINDINGS: Lungs are clear.  No pleural effusion or  pneumothorax. Cardiomegaly. Degenerative changes of the visualized thoracolumbar spine. IMPRESSION: No evidence of acute cardiopulmonary disease. Electronically Signed   By: Charline Bills M.D.   On: 04/13/2015 17:44   Ct Head Wo Contrast  04/13/2015  CLINICAL DATA:  Syncope. EXAM: CT HEAD WITHOUT CONTRAST TECHNIQUE: Contiguous axial images were obtained from the base of the skull through the vertex without intravenous contrast. COMPARISON:  CT scan of January 15, 2015. FINDINGS: Bony calvarium appears intact. Diffuse cortical atrophy is noted. Chronic ischemic white matter disease is noted. 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 No mass effect or midline shift is noted. Ventricular size is within normal limits. There is no evidence of mass lesion, hemorrhage or acute infarction. IMPRESSION: Diffuse cortical atrophy. Chronic ischemic white matter disease. 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 No acute intracranial abnormality seen. Electronically Signed   By: Lupita Raider, M.D.   On: 04/13/2015 19:21    Scheduled Meds: . aspirin EC  81 mg Oral Daily  . donepezil  10 mg Oral QHS  . enoxaparin (LOVENOX) injection  40 mg Subcutaneous QHS  . ezetimibe  10 mg Oral QHS  . famotidine  20 mg Oral BID  . LORazepam  0.5 mg Oral BID  . multivitamin with minerals  1 tablet Oral Daily  . sodium chloride  3 mL Intravenous Q12H  . traZODone  50 mg Oral QHS   Continuous Infusions: . sodium chloride 75 mL/hr at 04/13/15 2255      Time spent: 25 minutes    Eddie North  [facility] Pager [phone] If 7PM-7AM, please contact night-coverage at [email or web address], password [facility] 04/14/2015, 10:52 AM

## 2015-04-14 NOTE — Progress Notes (Addendum)
Called by staff to see pt for decreased responsiveness.  Per report pt has dementia but has been interacting with her family & talking.  Her family just left and now she is not responding to the staff.  Pt is alert and will open her eyes to voice and spontaneously.  She has purposeful movements such as scratching her nose and wiping her lips.  MAEE. She resists having her eyelids opened and does not allow herself to be hit in the face when her arm is dropped above her head.  When not engaged she will open her eyes and look around but immediately closes them when you speak to her.  VSS.  Primary RN to update provider as pt will not take her po night-time meds.

## 2015-04-14 NOTE — Progress Notes (Signed)
Received report on pt at 1130 this morning from previous nurse.  Restraints had been discontinued around 0700 this morning when family members arrived to unit.

## 2015-04-15 DIAGNOSIS — I951 Orthostatic hypotension: Principal | ICD-10-CM

## 2015-04-15 DIAGNOSIS — I10 Essential (primary) hypertension: Secondary | ICD-10-CM | POA: Diagnosis not present

## 2015-04-15 DIAGNOSIS — F0390 Unspecified dementia without behavioral disturbance: Secondary | ICD-10-CM | POA: Diagnosis not present

## 2015-04-15 LAB — GLUCOSE, CAPILLARY
GLUCOSE-CAPILLARY: 100 mg/dL — AB (ref 65–99)
GLUCOSE-CAPILLARY: 91 mg/dL (ref 65–99)
Glucose-Capillary: 121 mg/dL — ABNORMAL HIGH (ref 65–99)

## 2015-04-15 MED ORDER — HYDRALAZINE HCL 20 MG/ML IJ SOLN
10.0000 mg | Freq: Four times a day (QID) | INTRAMUSCULAR | Status: DC | PRN
  Filled 2015-04-15: qty 1

## 2015-04-15 MED ORDER — SODIUM CHLORIDE 0.9 % IV SOLN
INTRAVENOUS | Status: AC
  Administered 2015-04-15 – 2015-04-16 (×2): via INTRAVENOUS

## 2015-04-15 NOTE — Progress Notes (Signed)
Assumed care from Reden Banker(RN)

## 2015-04-15 NOTE — Progress Notes (Signed)
Daughter requested that pt receive bedtime trazadone immediately after dinner as she takes at home.  Order is written and okay'd with MD to be taken the same way at home, however it has been timed for the hospital time of 2200.  Pt's daughter stated that the pt normally goes to sleep at 1900 each evening and takes the trazadone at 1700.  Pt slept well all evening with no complaints and prior combative behavior. Will continue to monitor.

## 2015-04-15 NOTE — Progress Notes (Signed)
[facility] PROGRESS NOTE  Diana Allison GNF:[id number] DOB: [birth year] DOA: 04/13/2015 PCP: [name], FNP  Brief narrative 81 year old severely demented female with history of hypertension and hyperlipidemia was at her adult enrichment program during the day when she had a syncopal episode while sitting in a chair. She had loss of consciousness for almost 1-2 minutes and when she got up and she was increasingly confused. Her daughter reports that she had similar episode about 2 months back. EMS was called who found her blood glucose to be 44. Her blood glucose in the ED was 96. Vitals in the ED was unremarkable. Blood work done showed mild acute kidney injury. Head CT was done which was unremarkable. Patient was admitted for further workup.  Assessment/Plan: Syncope Stable on telemetry. Possibly due to orthostasis which is positive. . Order 2-D echo with normal EF and grade 1 Diastolic dysfn  EEG pending. Patient reportedly having low blood glucose per EMS. She is not diabetic.  fingersticks stable.  Continue gentle hydration. PlaceTed hose.  Hypoglycemia No clear etiology. Not on any medication that could potentially cause hypoglycemia. Would monitor closely.  Acute kidney injury Holding HCTZ and losartan. resolved with gentle hydration.  Hypokalemia Replenish  Essential hypertension Elevated BP. Placed on prn hydralazine.  Holding home blood pressure medications given acute kidney injury.  Severe dementia As per daughter she is at baseline. Continue Aricept and trazodone.  DVT prophylaxis: Subcutaneous Lovenox Diet: Heart healthy  Code Status: Full code Family Communication: none  at bedside  Disposition Plan: Home tomorrow after EEG done and BP  stable   Consultants:  None  Procedures:  Head CT  Antibiotics: None   HPI/Subjective: Seen and examined. No overnight issues except for elevated BP.   Objective: Filed Vitals:   04/15/15 1200  BP:  139/85  Pulse:   Temp:   Resp:     Intake/Output Summary (Last 24 hours) at 04/15/15 1331 Last data filed at 04/15/15 0832  Gross per 24 hour  Intake    360 ml  Output      0 ml  Net    360 ml   Filed Weights   04/13/15 2204  Weight: 58.5 kg (128 lb 15.5 oz)    Exam:   General:  not in distress  HEENT: , moist oral mucosa  Chest: Clear to auscultation bilaterally    CVS: Normal S1 and S2, no murmurs  GI: Soft, nondistended, nontender  Musculoskeletal: Warm, no edema  CNS: Awake and alert, not oriented    Data Reviewed: Basic Metabolic Panel:  Recent Labs Lab 04/13/15 1715 04/13/15 2235 04/14/15 0331  NA 142  --  137  K 4.4  --  3.2*  CL 104  --  104  CO2 29  --  26  GLUCOSE 96  --  124*  BUN 28*  --  16  CREATININE 1.27*  --  0.79  CALCIUM 9.4  --  8.5*  MG  --  2.0  --    Liver Function Tests:  Recent Labs Lab 04/13/15 1715  AST 21  ALT 17  ALKPHOS 55  BILITOT 0.5  PROT 6.3*  ALBUMIN 3.6   No results for input(s): LIPASE, AMYLASE in the last 168 hours. No results for input(s): AMMONIA in the last 168 hours. CBC:  Recent Labs Lab 04/13/15 1715 04/14/15 0331  WBC 3.7* 3.5*  NEUTROABS 1.0*  --   HGB 11.7* 11.4*  HCT 35.8* 35.5*  MCV 82.3 82.8  PLT  177 177   Cardiac Enzymes:  Recent Labs Lab 04/13/15 2235 04/14/15 0331 04/14/15 1105  TROPONINI <0.03 <0.03 <0.03   BNP (last 3 results) No results for input(s): BNP in the last 8760 hours.  ProBNP (last 3 results) No results for input(s): PROBNP in the last 8760 hours.  CBG:  Recent Labs Lab 04/14/15 0810 04/14/15 1118 04/14/15 1615 04/14/15 2140 04/15/15 1052  GLUCAP 158* 99 110* 129* 100*    No results found for this or any previous visit (from the past 240 hour(s)).   Studies: Dg Chest 2 View  04/13/2015  CLINICAL DATA:  Syncope EXAM: CHEST  2 VIEW COMPARISON:  03/28/2014 FINDINGS: Lungs are clear.  No pleural effusion or pneumothorax. Cardiomegaly.  Degenerative changes of the visualized thoracolumbar spine. IMPRESSION: No evidence of acute cardiopulmonary disease. Electronically Signed   By: Charline BillsSriyesh  Krishnan M.D.   On: 04/13/2015 17:44   Ct Head Wo Contrast  04/13/2015  CLINICAL DATA:  Syncope. EXAM: CT HEAD WITHOUT CONTRAST TECHNIQUE: Contiguous axial images were obtained from the base of the skull through the vertex without intravenous contrast. COMPARISON:  CT scan of January 15, 2015. FINDINGS: Bony calvarium appears intact. Diffuse cortical atrophy is noted. Chronic ischemic white matter disease is noted. 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 No mass effect or midline shift is noted. Ventricular size is within normal limits. There is no evidence of mass lesion, hemorrhage or acute infarction. IMPRESSION: Diffuse cortical atrophy. Chronic ischemic white matter disease. 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 No acute intracranial abnormality seen. Electronically Signed   By: Lupita RaiderJames  Green Jr, M.D.   On: 04/13/2015 19:21    Scheduled Meds: . aspirin EC  81 mg Oral Daily  . donepezil  10 mg Oral QHS  . enoxaparin (LOVENOX) injection  40 mg Subcutaneous QHS  . ezetimibe  10 mg Oral QHS  . famotidine  20 mg Oral BID  . LORazepam  0.5 mg Oral BID  . multivitamin with minerals  1 tablet Oral Daily  . sodium chloride  3 mL Intravenous Q12H  . traZODone  50 mg Oral QHS   Continuous Infusions:      Time spent: 25 minutes    Diana Allison  [facility] Pager [phone] If 7PM-7AM, please contact night-coverage at [email or web address], password [email or web address] 04/15/2015, 1:31 PM

## 2015-04-16 ENCOUNTER — Observation Stay (HOSPITAL_COMMUNITY): Payer: Medicare Other

## 2015-04-16 DIAGNOSIS — E876 Hypokalemia: Secondary | ICD-10-CM | POA: Diagnosis not present

## 2015-04-16 DIAGNOSIS — I951 Orthostatic hypotension: Secondary | ICD-10-CM | POA: Diagnosis present

## 2015-04-16 DIAGNOSIS — N179 Acute kidney failure, unspecified: Secondary | ICD-10-CM

## 2015-04-16 DIAGNOSIS — R55 Syncope and collapse: Secondary | ICD-10-CM | POA: Diagnosis not present

## 2015-04-16 DIAGNOSIS — R9401 Abnormal electroencephalogram [EEG]: Secondary | ICD-10-CM

## 2015-04-16 LAB — GLUCOSE, CAPILLARY
GLUCOSE-CAPILLARY: 96 mg/dL (ref 65–99)
GLUCOSE-CAPILLARY: 98 mg/dL (ref 65–99)
GLUCOSE-CAPILLARY: 137 mg/dL — AB (ref 65–99)

## 2015-04-16 LAB — PATHOLOGIST SMEAR REVIEW

## 2015-04-16 NOTE — Care Management Note (Addendum)
Case Management Note Donn PieriniKristi Charletta Voight RN, BSN Unit 2W-Case Manager [phone]  Patient Details  Name: Odelia GageLorraine M Voit MRN: [id number] Date of Birth: 09/18/1932  Subjective/Objective:   Pt admitted with syncope                 Action/Plan: PTA pt lived at home with daughter- [facility] the [facility]- per PT recommendation could benefit from continued therapy[facility] spoke with Synetta FailAnita and they [facility]- as long as there is an MD order for PT- pt will also need a note from MD that pt is cleared medically to return to center- MD if you could please provide note and order for PT [facility] The center can then arrange for the therapy there at center.   Expected Discharge Date:                  Expected Discharge Plan:  Home w Home Health Services  In-House Referral:     Discharge planning Services     Post Acute Care Choice:    Choice offered to:     DME Arranged:    DME Agency:     HH Arranged:    HH Agency:     Status of Service:  In process, will continue to follow  Medicare Important Message Given:    Date Medicare IM Given:    Medicare IM give by:    Date Additional Medicare IM Given:    Additional Medicare Important Message give by:     If discussed at Long Length of Stay Meetings, dates discussed:    Additional Comments:  Darrold SpanWebster, Waller Marcussen Hall, RN 04/16/2015, 11:40 AM

## 2015-04-16 NOTE — Procedures (Signed)
ELECTROENCEPHALOGRAM REPORT   Patient: Diana Allison       Age: 81 y.o.        Sex: female Referring Physician:  Report Date:  04/16/2015        Interpreting Physician: Omelia BlackwaterSUMNER, Jeylin Woodmansee JUSTIN  History: Diana GageLorraine M Mayorga is an 81 y.o. female hx of dementia, prior left parietal stroke presenting with episode of loss of consciousness in the setting of orthostasis and hypoglycemia.   Medications:  Scheduled: . aspirin EC  81 mg Oral Daily  . donepezil  10 mg Oral QHS  . enoxaparin (LOVENOX) injection  40 mg Subcutaneous QHS  . ezetimibe  10 mg Oral QHS  . famotidine  20 mg Oral BID  . LORazepam  0.5 mg Oral BID  . multivitamin with minerals  1 tablet Oral Daily  . sodium chloride  3 mL Intravenous Q12H  . traZODone  50 mg Oral QHS    Conditions of Recording:  This is a 16 channel EEG carried out with the patient in the awake state.  Description:  The waking background activity consists of a low to medium voltage, poorly organized mix of theta and delta slowing. There is intermittent generalized slowing of the left side compared to the right. Intermittent sharp waves are noted on the left side with a possible temporal/parietal focus.  Hyperventilation was not performed. Intermittent photic stimulation was not performed.    IMPRESSION: Abnormal EEG due to the following: 1)Generalized posterior background slowing which can be seen in a diffuse gray matter disturbance such as dementia 2)Focal left sided slowing and intermittent sharp wave activity indicating a potential area of abnormality and possible seizure foci. No epileptiform activity noted.   Elspeth Choeter Tyronza Happe, DO [facility] [phone]  If 7pm- 7am, please page neurology on call as listed in [facility]. 04/16/2015, 10:34 AM

## 2015-04-16 NOTE — Progress Notes (Signed)
Discharged instructions explained and given to daughter, voiced understanding. Pt. Wheelchair to front lobby.

## 2015-04-16 NOTE — Progress Notes (Signed)
EEG completed; results pending.    

## 2015-04-16 NOTE — Discharge Instructions (Signed)

## 2015-04-16 NOTE — Evaluation (Signed)
Physical Therapy Evaluation Patient Details Name: Diana Allison MRN: [id number] DOB: 1932/09/03 Today's Date: 04/16/2015   History of Present Illness  Diana Allison is a 81 y.o. female with a history of HTN, Hyperlipidemia and Severe Dementia who was at her Adult enrichment program during the day and was witnessed as having a syncopal episode while sitting in a chair. She had LOC for 1 -2 minutes, and was reported as confused when she came around. Her daughter reports that she has had previous similar episodes 1.5 months ago. EMS was called and reported that her glucose was 44. In the ED, her glucose level was 96.  Clinical Impression  Pt admitted with above diagnosis. Pt currently with functional limitations due to the deficits listed below (see PT Problem List).  Pt will benefit from skilled PT to increase their independence and safety with mobility to allow discharge to the venue listed below.  Pt requiring MIN HHA with gait today with lean to the R and decreased speed compared to PLOF according to daughter.  Recommend PT at adult day care to maximize pt's safety and decrease fall risk.  Will follow acutely as well.     Follow Up Recommendations Other (comment) (post acute PT at adult day care)    Equipment Recommendations  None recommended by PT    Recommendations for Other Services       Precautions / Restrictions Precautions Precautions: Fall Restrictions Weight Bearing Restrictions: No      Mobility  Bed Mobility               General bed mobility comments: Pt up in bed upon arrival  Transfers Overall transfer level: Needs assistance Equipment used: None Transfers: Sit to/from Stand Sit to Stand: Min assist         General transfer comment: Stood wtih MIN A for steadying  Ambulation/Gait Ambulation/Gait assistance: Min Environmental consultantassist Ambulation Distance (Feet): 100 Feet Assistive device: 1 person hand held assist Gait Pattern/deviations: Decreased  step length - right;Decreased step length - left;Narrow base of support;Drifts right/left;Decreased weight shift to left Gait velocity: decreased   General Gait Details: Amb with HHA with lean to the R and decreased attention to items on R side.  short trial without HHA with an increase in unsteadiness. Daughter states pt normally walks faster as well.  Stairs            Wheelchair Mobility    Modified Rankin (Stroke Patients Only)       Balance Overall balance assessment: Needs assistance       Postural control: Right lateral lean Standing balance support: Single extremity supported Standing balance-Leahy Scale: Fair Standing balance comment: Tends to lean R during gait                             Pertinent Vitals/Pain Pain Assessment: Faces Faces Pain Scale: No hurt    Home Living Family/patient expects to be discharged to:: Private residence Living Arrangements: Children Available Help at Discharge: Family   Home Access: Stairs to enter Entrance Stairs-Rails: Can reach both;Left;Right Entrance Stairs-Number of Steps: 8 Home Layout: One level Home Equipment: Environmental consultantWalker - 2 wheels;Cane - single point;Wheelchair - manual;Other (comment) (lift chair)      Prior Function Level of Independence: Independent         Comments: Pt ambulates without AD and manages stairs with B rails.       Hand Dominance  Extremity/Trunk Assessment   Upper Extremity Assessment: Overall WFL for tasks assessed           Lower Extremity Assessment: Overall WFL for tasks assessed;Generalized weakness         Communication   Communication: No difficulties  Cognition Arousal/Alertness: Awake/alert Behavior During Therapy: WFL for tasks assessed/performed Overall Cognitive Status: History of cognitive impairments - at baseline                      General Comments      Exercises        Assessment/Plan    PT Assessment Patient needs  continued PT services  PT Diagnosis Difficulty walking;Generalized weakness   PT Problem List Decreased balance;Decreased mobility;Decreased activity tolerance  PT Treatment Interventions Gait training;Functional mobility training;Therapeutic activities;Therapeutic exercise;Balance training   PT Goals (Current goals can be found in the Care Plan section) Acute Rehab PT Goals Patient Stated Goal: improve her walking PT Goal Formulation: With family Time For Goal Achievement: 04/23/15 Potential to Achieve Goals: Good    Frequency Min 3X/week   Barriers to discharge        Co-evaluation               End of Session Equipment Utilized During Treatment: Gait belt Activity Tolerance: Patient tolerated treatment well Patient left: in chair;with call bell/phone within reach;with nursing/sitter in room;with family/visitor present      Functional Assessment Tool Used: clinical judgment and objective findings Functional Limitation: Mobility: Walking and moving around Mobility: Walking and Moving Around Current Status [phone]): At least 1 percent but less than 20 percent impaired, limited or restricted Mobility: Walking and Moving Around Goal Status [phone]): At least 1 percent but less than 20 percent impaired, limited or restricted    Time: 1114-1130 PT Time Calculation (min) (ACUTE ONLY): 16 min   Charges:   PT Evaluation $Initial PT Evaluation Tier I: 1 Procedure     PT G Codes:   PT G-Codes **NOT FOR INPATIENT CLASS** Functional Assessment Tool Used: clinical judgment and objective findings Functional Limitation: Mobility: Walking and moving around Mobility: Walking and Moving Around Current Status (Y7829): At least 1 percent but less than 20 percent impaired, limited or restricted Mobility: Walking and Moving Around Goal Status [phone]): At least 1 percent but less than 20 percent impaired, limited or restricted    Myshawn Chiriboga LUBECK 04/16/2015, 12:12 PM

## 2015-04-16 NOTE — Discharge Summary (Signed)
Physician Discharge Summary  Diana Allison ZOX:[id number] DOB: 1932/08/12 DOA: 04/13/2015  PCP: Diana FosterPadonda B Webb, FNP  Admit date: 04/13/2015 Discharge date: 04/16/2015  Time spent: 25 minutes  Recommendations for Outpatient Follow-up:  1. Discharge home with home health PT. 2. Follow-up with PCP in one week  Discharge Diagnoses:  Principal problem Syncope and collapse  Active Problems:   Orthostatic hypotension   Essential hypertension   Pure hypercholesterolemia   Severe dementia   AKI (acute kidney injury) (HCC)   Hypokalemia   Discharge Condition: Fair  Diet recommendation: Regular  Filed Weights   04/13/15 2204  Weight: 58.5 kg (128 lb 15.5 oz)    History of present illness:  Please refer to admission H&P for details, in brief, 81 year old severely demented female with history of hypertension and hyperlipidemia was at her adult enrichment program during the day when she had a syncopal episode while sitting in a chair. She had loss of consciousness for almost 1-2 minutes and when she got up and she was increasingly confused. Her daughter reports that she had similar episode about 2 months back. EMS was called who found her blood glucose to be 44. Her blood glucose in the ED was 96. Vitals in the ED was unremarkable. Blood work done showed mild acute kidney injury. Head CT was done which was unremarkable. Patient was admitted for further workup.  Hospital Course:  Syncope Stable on telemetry. Possibly due to orthostatic BP. Also appear dehydrated on presentation with mild achy and hypokalemia.Marland Kitchen. 2-D echo showed normal EF and grade 1 Diastolic dysfn EEG showed focal left-sided slowing with intermittent sharp wave activity possible for seizure foci. No epileptiform activity was noted. Patient also has history of left parietal area hemorrhage in the past. Discussed with neurologist Dr. Hosie PoissonSumner who did the EEG and given the fact that patient has positive orthostasis and also  had low blood glucose when checked by EMS this could be contributed to her syncope rather than true seizures. Given that this is the first episode no witnessed seizure activity, I have decided against using antiepileptics. Should be followed as outpatient. -Fingersticks glucose were stable while in the hospital. -Patient still has some orthostasis but improved. I have discontinued her HCTZ and continued losartan. -TED hose applied. Daughter informs that patient is supervised 24 hours and does not ambulate without assistance. -Seen by physical therapy and recommended home health PT. Will arrange physical therapy at the daycare center when she goes every day. -Blood pressure should be closely followed as outpatient. I have discussed the hospital course with daughter in detail on the phone.  Hypoglycemia No clear etiology. Not on any medication that could potentially cause hypoglycemia. fsg remained stable while in the hospital.  Acute kidney injury Held HCTZ and losartan. resolved with gentle hydration. I will discontinue her HCTZ and resume losartan upon discharge.  Hypokalemia Replenished  Essential hypertensioIs  discontinue HCTZ. Resume losartan.   Severe dementia As per daughter she is at baseline. Continue Aricept and trazodone.    Code Status: Full code Family Communication: Spoke with Daughter on the phone Disposition Plan: Home with home health   Consultants:  None  Procedures:  Head CT  2-D echo  EEG  Antibiotics: None   Discharge Exam: Filed Vitals:   04/16/15 1304  BP: 164/84  Pulse: 82  Temp: 98  Resp:     General: A female not in distress, confused  HEENT: No pallor, moist oral mucosa, supple neck Chest: Clear to bilaterally CVS: Normal  S1 and S2, no murmurs or gallop GI: Soft, nondistended, nontender, bowel sounds present  musculoskeletal: Warm, no edema CNS: Awake, disoriented at baseline (severe dementia)   Discharge  Instructions    Current Discharge Medication List    CONTINUE these medications which have NOT CHANGED   Details  aspirin EC 81 MG tablet Take 81 mg by mouth daily.    donepezil (ARICEPT) 10 MG tablet Take 10 mg by mouth at bedtime.  Refills: 0    ezetimibe (ZETIA) 10 MG tablet Take 10 mg by mouth at bedtime.     LORazepam (ATIVAN) 0.5 MG tablet TAKE 1 TABLET BY MOUTH TWICE A DAY Qty: 60 tablet, Refills: 0    losartan (COZAAR) 100 MG tablet Take 1 tablet (100 mg total) by mouth daily. Qty: 30 tablet, Refills: 3    Multiple Vitamin (MULTIVITAMIN WITH MINERALS) TABS tablet Take 1 tablet by mouth daily.    polyethylene glycol powder (GLYCOLAX/MIRALAX) powder TAKE 17 GRAMS MIXED IN LIQUID BY MOUTH DAILY. Qty: 527 g, Refills: 1    ranitidine (ZANTAC) 300 MG capsule Take 300 mg by mouth 2 (two) times daily as needed for heartburn.  Refills: 6    traZODone (DESYREL) 50 MG tablet TAKE 1 TABLET BY MOUTH AT BEDTIME **PATIENT NEEDS APPOINTMENT** Qty: 30 tablet, Refills: 0      STOP taking these medications     hydrochlorothiazide (MICROZIDE) 12.5 MG capsule      esomeprazole (NEXIUM) 40 MG capsule      hydrocortisone (ANUSOL-HC) 25 MG suppository      pramoxine-hydrocortisone (EPIFOAM) 1-1 % foam      sulfamethoxazole-trimethoprim (BACTRIM DS,SEPTRA DS) 800-160 MG tablet        No Known Allergies Follow-up Information    Follow up with Diana Foster, FNP In 1 week.   Specialty:  Family Medicine   Contact information:   [address] [place]ena Flake [place] [address] [phone]        The results of significant diagnostics from this hospitalization (including imaging, microbiology, ancillary and laboratory) are listed below for reference.    Significant Diagnostic Studies: Dg Chest 2 View  04/13/2015  CLINICAL DATA:  Syncope EXAM: CHEST  2 VIEW COMPARISON:  03/28/2014 FINDINGS: Lungs are clear.  No pleural effusion or pneumothorax. Cardiomegaly. Degenerative  changes of the visualized thoracolumbar spine. IMPRESSION: No evidence of acute cardiopulmonary disease. Electronically Signed   By: Charline Bills M.D.   On: 04/13/2015 17:44   Ct Head Wo Contrast  04/13/2015  CLINICAL DATA:  Syncope. EXAM: CT HEAD WITHOUT CONTRAST TECHNIQUE: Contiguous axial images were obtained from the base of the skull through the vertex without intravenous contrast. COMPARISON:  CT scan of January 15, 2015. FINDINGS: Bony calvarium appears intact. Diffuse cortical atrophy is noted. Chronic ischemic white matter disease is noted. 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 No mass effect or midline shift is noted. Ventricular size is within normal limits. There is no evidence of mass lesion, hemorrhage or acute infarction. IMPRESSION: Diffuse cortical atrophy. Chronic ischemic white matter disease. 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 No acute intracranial abnormality seen. Electronically Signed   By: Lupita Raider, M.D.   On: 04/13/2015 19:21    Microbiology: No results found for this or any previous visit (from the past 240 hour(s)).   Labs: Basic Metabolic Panel:  Recent Labs Lab 04/13/15 1715 04/13/15 2235 04/14/15 0331  NA 142  --  137  K 4.4  --  3.2*  CL 104  --  104  CO2 29  --  26  GLUCOSE 96  --  124*  BUN 28*  --  16  CREATININE 1.27*  --  0.79  CALCIUM 9.4  --  8.5*  MG  --  2.0  --    Liver Function Tests:  Recent Labs Lab 04/13/15 1715  AST 21  ALT 17  ALKPHOS 55  BILITOT 0.5  PROT 6.3*  ALBUMIN 3.6   No results for input(s): LIPASE, AMYLASE in the last 168 hours. No results for input(s): AMMONIA in the last 168 hours. CBC:  Recent Labs Lab 04/13/15 1715 04/14/15 0331  WBC 3.7* 3.5*  NEUTROABS 1.0*  --   HGB 11.7* 11.4*  HCT 35.8* 35.5*  MCV 82.3 82.8  PLT 177 177   Cardiac Enzymes:  Recent Labs Lab 04/13/15 2235 04/14/15 0331 04/14/15 1105  TROPONINI <0.03 <0.03 <0.03   BNP: BNP (last 3 results) No results for input(s): BNP  in the last 8760 hours.  ProBNP (last 3 results) No results for input(s): PROBNP in the last 8760 hours.  CBG:  Recent Labs Lab 04/15/15 1627 04/15/15 1955 04/16/15 0338 04/16/15 0756 04/16/15 1112  GLUCAP 91 121* 96 98 137*       Signed:  Auron Tadros  [facility] 04/16/2015, 1:24 PM

## 2015-04-17 NOTE — Care Management Note (Addendum)
Case Management Note Donn PieriniKristi Gazelle Towe RN, BSN Unit 2W-Case Manager [phone]  Patient Details  Name: Diana Allison MRN: [id number] Date of Birth: Jun 22, 1933  Subjective/Objective:   Pt admitted with syncope                 Action/Plan: PTA pt lived at home with daughter- [facility]- per PT recommendation could benefit from continued therapy- [facility] spoke with Synetta FailAnita and they do provide PT at the center [facility]- as long as there is an MD order for PT- pt will also need a note from MD that pt is cleared medically to return to center- MD if you could please provide note and order for PT for [facility]. The center can then arrange for the therapy there at center.   Expected Discharge Date:       04/16/15           Expected Discharge Plan:  Home w Home Health Services  In-House Referral:     Discharge planning Services  CM Consult  Post Acute Care Choice:  Home Health Choice offered to:  NA  DME Arranged:    DME Agency:     HH Arranged:  PT HH Agency:  [facility]  Status of Service:  Completed, signed off  Medicare Important Message Given:    Date Medicare IM Given:    Medicare IM give by:    Date Additional Medicare IM Given:    Additional Medicare Important Message give by:     If discussed at Long Length of Stay Meetings, dates discussed:    Additional Comments:  04/17/15- F/u done [facility] on [address]  msg left for Synetta FailAnita regarding PT arrangements- per msg received from [place]- [facility]- per Synetta FailAnita [facility] and she would prefer that CM makes referral from here if possible- [facility]aren [facility] for [facility] at the [facility]- Clydie BraunKaren to check into arrangements for PT at the center.   Darrold SpanWebster, Cornesha Radziewicz Hall, RN 04/17/2015,  10:08 AM

## 2015-04-18 ENCOUNTER — Telehealth: Payer: Self-pay | Admitting: *Deleted

## 2015-04-18 NOTE — Telephone Encounter (Signed)
Transition Care Management Follow-up Telephone Call  How have you been since you were released from the hospital? Doing good   Do you understand why you were in the hospital? yes   Do you understand the discharge instrcutions? yes  Items Reviewed:  Medications reviewed: yes  Allergies reviewed: yes  Dietary changes reviewed: yes  Referrals reviewed: yes   Functional Questionnaire:   Activities of Daily Living (ADLs):   She states they are independent in the following: patient has Kazakhstandemnia States they require assistance with the following: yes with daughters help   Any transportation issues/concerns?: no   Any patient concerns? no   Confirmed importance and date/time of follow-up visits scheduled: yes 04/23/15 with Denyse Amassorey   Confirmed with patient if condition begins to worsen call PCP or go to the ER.  Patient was given the Call-a-Nurse line [phone]: yes Patient was discharged 04/16/15 Patient was discharged to her home Patient has an appointment with Denyse Amassorey 04/23/15 at 2 pm

## 2015-04-23 ENCOUNTER — Ambulatory Visit: Payer: Medicare Other | Admitting: Adult Health

## 2015-04-24 ENCOUNTER — Ambulatory Visit: Payer: Medicare Other | Admitting: Adult Health

## 2015-04-25 ENCOUNTER — Encounter: Payer: Self-pay | Admitting: Adult Health

## 2015-04-25 ENCOUNTER — Ambulatory Visit (INDEPENDENT_AMBULATORY_CARE_PROVIDER_SITE_OTHER): Payer: Medicare Other | Admitting: Adult Health

## 2015-04-25 VITALS — BP 160/100 | Temp 98.1°F | Ht 61.0 in | Wt 132.1 lb

## 2015-04-25 DIAGNOSIS — Z09 Encounter for follow-up examination after completed treatment for conditions other than malignant neoplasm: Secondary | ICD-10-CM

## 2015-04-25 MED ORDER — LOSARTAN POTASSIUM 50 MG PO TABS: 50.0000 mg | ORAL_TABLET | Freq: Every day | ORAL | Status: DC

## 2015-04-25 NOTE — Progress Notes (Signed)
   Subjective:    Patient ID: Diana Allison, female    DOB: 23-Jul-1932, 81 y.o.   MRN: [id number]  HPI   81 year old female who presents to the office today after hospital stay from 10/21/-10/24 for syncopal episode. Per hospital note:   "In brief, 81 year old severely demented female with history of hypertension and hyperlipidemia was at her adult enrichment program during the day when she had a syncopal episode while sitting in a chair. She had loss of consciousness for almost 1-2 minutes and when she got up and she was increasingly confused. Her daughter reports that she had similar episode about 2 months back. EMS was called who found her blood glucose to be 44. Her blood glucose in the ED was 96. Vitals in the ED was unremarkable. Blood work done showed mild acute kidney injury. Head CT was done which was unremarkable. Patient was admitted for further workup."  She is at this visit with her daughter who is the main caregiver. Her daughter reports that the patient seems to be back to her baseline. She has not had any syncopal episodes since being out of the hospital.   Her blood pressure is elevated due to it being discontinued in the hospital and was never restarted.   Review of Systems  Unable to perform ROS: Dementia  Psychiatric/Behavioral: Positive for confusion and agitation.       Objective:   Physical Exam  Constitutional: She appears well-developed and well-nourished. No distress.  Cardiovascular: Normal rate, regular rhythm, normal heart sounds and intact distal pulses.  Exam reveals no gallop and no friction rub.   No murmur heard. Pulmonary/Chest: Effort normal and breath sounds normal. No respiratory distress. She has no wheezes. She has no rales. She exhibits no tenderness.  Musculoskeletal:  Walks with slow steady gait  Neurological: She is alert.  Skin: Skin is warm and dry. No rash noted. She is not diaphoretic. No erythema. No pallor.  Psychiatric:  She is  agitated and does not want to participate with exam  Nursing note and vitals reviewed.      Assessment & Plan:  1. Hospital discharge follow-up - Appears to be back at baseline - Basic metabolic panel - CMP - losartan (COZAAR) 50 MG tablet; Take 1 tablet (50 mg total) by mouth daily.  Dispense: 90 tablet; Refill: 1 - decreased dose from 100mg  and daughter will monitor BP at home and inform us what the readings are.  - Follow up as needed - Reviewed lab work and imaging from hospital stay.  - Her last labs before being discharged showed her being hypokalemic at 3.2. She was taken off her HCTZ while in the hospital and it was not restarted. Will see if this has corrected itself.

## 2015-04-25 NOTE — Patient Instructions (Signed)
It was a pleasure meeting you today!  I am glad you are back to normal after your hospital stay  I have sent in a prescription for Losartan 50mg , monitor blood pressure at home and inform your PCP of the readings. If they are going below 110/80, on a constant basis then stop the medication and inform PCP  If you need anything, please let me know

## 2015-04-25 NOTE — Progress Notes (Signed)
Pre visit review using our clinic review tool, if applicable. No additional management support is needed unless otherwise documented below in the visit note. 

## 2015-04-26 ENCOUNTER — Ambulatory Visit: Payer: Medicare Other | Admitting: Adult Health

## 2015-04-26 LAB — COMPREHENSIVE METABOLIC PANEL
ALK PHOS: 53 U/L (ref 39–117)
ALT: 11 U/L (ref 0–35)
AST: 13 U/L (ref 0–37)
Albumin: 3.8 g/dL (ref 3.5–5.2)
BILIRUBIN TOTAL: 0.4 mg/dL (ref 0.2–1.2)
BUN: 28 mg/dL — AB (ref 6–23)
CO2: 34 meq/L — AB (ref 19–32)
Calcium: 9.4 mg/dL (ref 8.4–10.5)
Chloride: 102 mEq/L (ref 96–112)
Creatinine, Ser: 1.19 mg/dL (ref 0.40–1.20)
GFR: 56.07 mL/min — AB (ref >60.00)
GLUCOSE: 97 mg/dL (ref 70–99)
POTASSIUM: 4.2 meq/L (ref 3.5–5.1)
Sodium: 142 mEq/L (ref 135–145)
TOTAL PROTEIN: 6.7 g/dL (ref 6.0–8.3)

## 2015-04-26 LAB — BASIC METABOLIC PANEL
BUN: 28 mg/dL — AB (ref 6–23)
CHLORIDE: 102 meq/L (ref 96–112)
CO2: 34 meq/L — AB (ref 19–32)
CREATININE: 1.19 mg/dL (ref 0.40–1.20)
Calcium: 9.4 mg/dL (ref 8.4–10.5)
GFR: 56.07 mL/min — ABNORMAL LOW (ref >60.00)
Glucose, Bld: 97 mg/dL (ref 70–99)
Potassium: 4.2 mEq/L (ref 3.5–5.1)
Sodium: 142 mEq/L (ref 135–145)

## 2015-04-27 ENCOUNTER — Other Ambulatory Visit: Payer: Self-pay | Admitting: Family

## 2015-05-10 ENCOUNTER — Other Ambulatory Visit: Payer: Self-pay | Admitting: Adult Health

## 2015-06-06 ENCOUNTER — Other Ambulatory Visit: Payer: Self-pay | Admitting: Family

## 2015-06-14 ENCOUNTER — Other Ambulatory Visit: Payer: Self-pay | Admitting: Family

## 2015-06-19 ENCOUNTER — Encounter (HOSPITAL_COMMUNITY): Payer: Self-pay | Admitting: Emergency Medicine

## 2015-06-19 ENCOUNTER — Inpatient Hospital Stay (HOSPITAL_COMMUNITY)
Admission: EM | Admit: 2015-06-19 | Discharge: 2015-06-23 | DRG: 071 | Disposition: A | Discharge status: Skilled Nursing Facility | Payer: Medicare Other | Attending: Internal Medicine | Admitting: Internal Medicine

## 2015-06-19 DIAGNOSIS — Z66 Do not resuscitate: Secondary | ICD-10-CM | POA: Diagnosis present

## 2015-06-19 DIAGNOSIS — R410 Disorientation, unspecified: Secondary | ICD-10-CM | POA: Diagnosis not present

## 2015-06-19 DIAGNOSIS — E785 Hyperlipidemia, unspecified: Secondary | ICD-10-CM | POA: Diagnosis present

## 2015-06-19 DIAGNOSIS — F0150 Vascular dementia without behavioral disturbance: Secondary | ICD-10-CM | POA: Diagnosis present

## 2015-06-19 DIAGNOSIS — F0390 Unspecified dementia without behavioral disturbance: Secondary | ICD-10-CM | POA: Diagnosis present

## 2015-06-19 DIAGNOSIS — E876 Hypokalemia: Secondary | ICD-10-CM | POA: Diagnosis present

## 2015-06-19 DIAGNOSIS — D649 Anemia, unspecified: Secondary | ICD-10-CM | POA: Diagnosis present

## 2015-06-19 DIAGNOSIS — N390 Urinary tract infection, site not specified: Secondary | ICD-10-CM | POA: Diagnosis present

## 2015-06-19 DIAGNOSIS — N182 Chronic kidney disease, stage 2 (mild): Secondary | ICD-10-CM | POA: Diagnosis present

## 2015-06-19 DIAGNOSIS — G9340 Encephalopathy, unspecified: Secondary | ICD-10-CM | POA: Diagnosis not present

## 2015-06-19 DIAGNOSIS — D72819 Decreased white blood cell count, unspecified: Secondary | ICD-10-CM | POA: Diagnosis present

## 2015-06-19 DIAGNOSIS — Z8249 Family history of ischemic heart disease and other diseases of the circulatory system: Secondary | ICD-10-CM

## 2015-06-19 DIAGNOSIS — I129 Hypertensive chronic kidney disease with stage 1 through stage 4 chronic kidney disease, or unspecified chronic kidney disease: Secondary | ICD-10-CM | POA: Diagnosis present

## 2015-06-19 DIAGNOSIS — R296 Repeated falls: Secondary | ICD-10-CM | POA: Diagnosis present

## 2015-06-19 LAB — CBC
HEMATOCRIT: 36.5 % (ref 36.0–46.0)
HEMOGLOBIN: 11.5 g/dL — AB (ref 12.0–15.0)
MCH: 26.7 pg (ref 26.0–34.0)
MCHC: 31.5 g/dL (ref 30.0–36.0)
MCV: 84.7 fL (ref 78.0–100.0)
Platelets: 221 10*3/uL (ref 150–400)
RBC: 4.31 MIL/uL (ref 3.87–5.11)
RDW: 14.5 % (ref 11.5–15.5)
WBC: 4.6 10*3/uL (ref 4.0–10.5)

## 2015-06-19 LAB — BASIC METABOLIC PANEL
Anion gap: 10 (ref 5–15)
BUN: 29 mg/dL — ABNORMAL HIGH (ref 6–20)
CALCIUM: 9 mg/dL (ref 8.9–10.3)
CO2: 30 mmol/L (ref 22–32)
CREATININE: 1.08 mg/dL — AB (ref 0.44–1.00)
Chloride: 103 mmol/L (ref 101–111)
GFR calc Af Amer: 55 mL/min — ABNORMAL LOW (ref >60)
GFR calc non Af Amer: 47 mL/min — ABNORMAL LOW (ref >60)
GLUCOSE: 125 mg/dL — AB (ref 65–99)
Potassium: 3.2 mmol/L — ABNORMAL LOW (ref 3.5–5.1)
Sodium: 143 mmol/L (ref 135–145)

## 2015-06-19 LAB — CBG MONITORING, ED: Glucose-Capillary: 99 mg/dL (ref 65–99)

## 2015-06-19 MED ORDER — SODIUM CHLORIDE 0.9 % IV BOLUS (SEPSIS)
500.0000 mL | Freq: Once | INTRAVENOUS | Status: AC
  Administered 2015-06-20: 500 mL via INTRAVENOUS

## 2015-06-19 NOTE — ED Provider Notes (Signed)
CSN: [id number]     Arrival date & time 06/19/15  1653 History  By signing my name below, I, Diana Allison, attest that this documentation has been prepared under the direction and in the presence of Diana MondayErin Ashland Wiseman, MD. Electronically Signed: Phillis HaggisGabriella Allison, ED Scribe. 06/19/2015. 3:27 AM.  Chief Complaint  Patient presents with  . Fall  . Weakness   Patient is a 81 y.o. female presenting with fall and weakness. The history is provided by a relative. No language interpreter was used.  Fall This is a new problem. The current episode started more than 2 days ago. The problem occurs constantly. The problem has been gradually worsening. Pertinent negatives include no chest pain. She has tried nothing for the symptoms.  Weakness This is a new problem. The current episode started more than 2 days ago. The problem occurs constantly. The problem has been gradually worsening. Pertinent negatives include no chest pain. She has tried nothing for the symptoms.  HPI Comments: Diana Allison is a 81 y.o. female with a hx of dementia who presents to the Emergency Department complaining of right sided weakness and frequent falls onset 5 days ago. Family states that the pt has not been off balance since Friday and fell about 7 times over the course of the weekend. Family states that the right side of the pt's body has been weak since Saturday. She states that the pt is typically able to ambulate on her own around the house and up the stairs. She reports that the pt has also been waking up at night, which she does not typically do. They would often find her on the floor when she would get up in the middle of exam. Family reports weakness is worsening, stating that she leans to the right side when she stands. Per triage note, pt has also not been eating much for 2-3 days and has a constant, unchanged, productive cough with sputum for the past few weeks. Family denies syncope, states that pt has not been complaining  of pain, even when she falls, fever, chills, nausea, vomiting, or diarrhea. They state that the pt's baseline is mumbling when talking but not making much sense with her words. Daughter states that pt will respond more to "Mrs. Massoud" than "Mom." Pt is not on any blood thinners.   Past Medical History  Diagnosis Date  . Hyperlipidemia   . Hypertension   . Cataract   . Dementia    Past Surgical History  Procedure Laterality Date  . Knee surgery    . Vaginal hysterectomy     Family History  Problem Relation Age of Onset  . Dementia Neg Hx   . Hypertension Daughter    Social History  Substance Use Topics  . Smoking status: Never Smoker   . Smokeless tobacco: Never Used  . Alcohol Use: No   OB History    No data available     Review of Systems  Unable to perform ROS: Dementia  Constitutional: Positive for appetite change. Negative for fever and chills.  HENT: Negative for sore throat.   Eyes: Negative for visual disturbance.  Respiratory: Positive for cough.   Cardiovascular: Negative for chest pain.  Gastrointestinal: Negative for nausea, vomiting and diarrhea.  Musculoskeletal: Positive for gait problem.  Skin: Negative for rash.  Neurological: Positive for weakness. Negative for syncope.  Psychiatric/Behavioral: Positive for sleep disturbance.   Allergies  Review of patient's allergies indicates no known allergies.  Home Medications   Prior to  Admission medications   Medication Sig Start Date End Date Taking? Authorizing Provider  aspirin EC 81 MG tablet Take 81 mg by mouth daily.   Yes Historical Provider, MD  cholecalciferol (VITAMIN D) 1000 units tablet Take 1,000 Units by mouth daily.   Yes Historical Provider, MD  COCONUT OIL PO Take 2 capsules by mouth 2 (two) times daily.   Yes Historical Provider, MD  donepezil (ARICEPT) 10 MG tablet Take 10 mg by mouth at bedtime.  06/30/14  Yes Historical Provider, MD  ezetimibe (ZETIA) 10 MG tablet Take 10 mg by mouth at  bedtime.    Yes Historical Provider, MD  Multiple Vitamin (MULTIVITAMIN WITH MINERALS) TABS tablet Take 1 tablet by mouth daily.   Yes Historical Provider, MD  polyethylene glycol powder (GLYCOLAX/MIRALAX) powder TAKE 17 GRAMS MIXED IN LIQUID BY MOUTH DAILY. 06/07/15  Yes Shirline Frees, NP  ranitidine (ZANTAC) 300 MG capsule Take 300 mg by mouth daily.  01/01/15  Yes Historical Provider, MD  sertraline (ZOLOFT) 50 MG tablet TAKE 1 & 1/2 TABLETS BY MOUTH ONCE A DAY 05/12/15  Yes Historical Provider, MD  traZODone (DESYREL) 50 MG tablet TAKE 1 TABLET BY MOUTH AT BEDTIME **PATIENT NEEDS APPOINTMENT** 05/10/15  Yes Shirline Frees, NP  LORazepam (ATIVAN) 0.5 MG tablet TAKE 1 TABLET BY MOUTH TWICE A DAY Patient taking differently: TAKE 1 TABLET BY MOUTH TWICE A DAY AS NEEDED FOR ANXIETY 12/13/14   [name], FNP  losartan (COZAAR) 50 MG tablet Take 1 tablet (50 mg total) by mouth daily. Patient not taking: Reported on 06/19/2015 04/25/15   Shirline Frees, NP   BP 160/75 mmHg  Pulse 79  Temp(Src) 98.1 F (36.7 C) (Oral)  Resp 18  Ht 5' (1.524 m)  Wt 128 lb (58.06 kg)  BMI 25.00 kg/m2  SpO2 97% Physical Exam  Constitutional: She appears well-developed and well-nourished. No distress.  HENT:  Head: Normocephalic and atraumatic.  Eyes: Conjunctivae and EOM are normal. Pupils are equal, round, and reactive to light.  Neck: Normal range of motion.  Cardiovascular: Normal rate, regular rhythm, normal heart sounds and intact distal pulses.  Exam reveals no gallop and no friction rub.   No murmur heard. Pulmonary/Chest: Effort normal and breath sounds normal. No respiratory distress. She has no wheezes. She has no rales.  Abdominal: Soft. She exhibits no distension. There is no tenderness. There is no guarding.  Musculoskeletal: She exhibits no edema or tenderness.  Neurological: She is alert. Cranial nerve deficit: pt will not open mouth or stick out tongue, sill not smile however no sign of facial  droop, EOM appear intact. Sensory deficit: pt not speaking. Abnormal coordination: will notp articipate in exam.  Equal upper extremity strength Will lift both legs slightly with encouragement but will not follow commands for full LE strength   Skin: Skin is warm and dry. No rash noted. She is not diaphoretic. No erythema.  Nursing note and vitals reviewed.   ED Course  Procedures (including critical care time) DIAGNOSTIC STUDIES: Oxygen Saturation is 90% on RA, low by my interpretation.    COORDINATION OF CARE: 11:54 PM-Discussed treatment plan which includes labs, CT, and x-ray with family at bedside and family agreed to plan.    Labs Review Labs Reviewed  BASIC METABOLIC PANEL - Abnormal; Notable for the following:    Potassium 3.2 (*)    Glucose, Bld 125 (*)    BUN 29 (*)    Creatinine, Ser 1.08 (*)    GFR  calc non Af Amer 47 (*)    GFR calc Af Amer 55 (*)    All other components within normal limits  CBC - Abnormal; Notable for the following:    Hemoglobin 11.5 (*)    All other components within normal limits  URINALYSIS, ROUTINE W REFLEX MICROSCOPIC (NOT AT [facility]) - Abnormal; Notable for the following:    APPearance CLOUDY (*)    Hgb urine dipstick TRACE (*)    Leukocytes, UA MODERATE (*)    All other components within normal limits  URINE MICROSCOPIC-ADD ON - Abnormal; Notable for the following:    Squamous Epithelial / LPF 6-30 (*)    Bacteria, UA FEW (*)    All other components within normal limits  CBC - Abnormal; Notable for the following:    WBC 3.3 (*)    Hemoglobin 11.2 (*)    HCT 35.9 (*)    All other components within normal limits  COMPREHENSIVE METABOLIC PANEL - Abnormal; Notable for the following:    Potassium 3.1 (*)    Glucose, Bld 161 (*)    Calcium 8.7 (*)    All other components within normal limits  CULTURE, BLOOD (ROUTINE X 2)  URINE CULTURE  CULTURE, BLOOD (ROUTINE X 2)  CBC  BASIC METABOLIC PANEL  MAGNESIUM  CBG MONITORING, ED  I-STAT  CG4 LACTIC ACID, ED  I-STAT CG4 LACTIC ACID, ED    Imaging Review Dg Chest 2 View  06/20/2015  CLINICAL DATA:  81 year old female with increasing weakness an multiple falls. EXAM: CHEST  2 VIEW COMPARISON:  Radiograph dated 04/13/2015 FINDINGS: Two views of the chest do not demonstrate a focal consolidation. There is no pleural effusion or pneumothorax. Stable cardiac silhouette. There is severe osteopenia with degenerative changes of the spine. IMPRESSION: No active cardiopulmonary disease. Electronically Signed   By: Elgie Collard M.D.   On: 06/20/2015 01:12   Ct Head Wo Contrast  06/20/2015  CLINICAL DATA:  81 year old female with right-sided weakness and frequent falls. EXAM: CT HEAD WITHOUT CONTRAST CT CERVICAL SPINE WITHOUT CONTRAST TECHNIQUE: Multidetector CT imaging of the head and cervical spine was performed following the standard protocol without intravenous contrast. Multiplanar CT image reconstructions of the cervical spine were also generated. COMPARISON:  Head CT dated 04/13/2015 and study dated 03/28/2014 FINDINGS: CT HEAD FINDINGS The ventricles are dilated and the sulci are prominent compatible with age-related atrophy. Periventricular and deep white matter hypodensities represent chronic microvascular ischemic changes. There is no intracranial hemorrhage. No mass effect or midline shift identified. Small pockets of gas noted in the skullbase and in the region of the cavernous sinus, likely iatrogenic and related to IV access. The visualized paranasal sinuses and mastoid air cells are well aerated. The calvarium is intact. CT CERVICAL SPINE FINDINGS Evaluation of this exam is limited due to motion artifact. There is no definite acute fracture or subluxation of the cervical spine.There is osteopenia with multilevel degenerative changes. There is a 1 cm lytic appear lesion involving the left aspect of the vertebral body and left pedicle of the C5 similar to prior study dated 03/28/2014  likely related to expanded transverse foramen due to tortuous vessel. The odontoid and spinous processes are intact.There is normal anatomic alignment of the C1-C2 lateral masses. The visualized soft tissues appear unremarkable. IMPRESSION: No acute intracranial hemorrhage. Age-related atrophy and chronic microvascular ischemic disease. If symptoms persist and there are no contraindications, MRI may provide better evaluation if clinically indicated. No acute fracture of the cervical spine.  Electronically Signed   By: Elgie Collard M.D.   On: 06/20/2015 01:39   Ct Cervical Spine Wo Contrast  06/20/2015  CLINICAL DATA:  81 year old female with right-sided weakness and frequent falls. EXAM: CT HEAD WITHOUT CONTRAST CT CERVICAL SPINE WITHOUT CONTRAST TECHNIQUE: Multidetector CT imaging of the head and cervical spine was performed following the standard protocol without intravenous contrast. Multiplanar CT image reconstructions of the cervical spine were also generated. COMPARISON:  Head CT dated 04/13/2015 and study dated 03/28/2014 FINDINGS: CT HEAD FINDINGS The ventricles are dilated and the sulci are prominent compatible with age-related atrophy. Periventricular and deep white matter hypodensities represent chronic microvascular ischemic changes. There is no intracranial hemorrhage. No mass effect or midline shift identified. Small pockets of gas noted in the skullbase and in the region of the cavernous sinus, likely iatrogenic and related to IV access. The visualized paranasal sinuses and mastoid air cells are well aerated. The calvarium is intact. CT CERVICAL SPINE FINDINGS Evaluation of this exam is limited due to motion artifact. There is no definite acute fracture or subluxation of the cervical spine.There is osteopenia with multilevel degenerative changes. There is a 1 cm lytic appear lesion involving the left aspect of the vertebral body and left pedicle of the C5 similar to prior study dated 03/28/2014  likely related to expanded transverse foramen due to tortuous vessel. The odontoid and spinous processes are intact.There is normal anatomic alignment of the C1-C2 lateral masses. The visualized soft tissues appear unremarkable. IMPRESSION: No acute intracranial hemorrhage. Age-related atrophy and chronic microvascular ischemic disease. If symptoms persist and there are no contraindications, MRI may provide better evaluation if clinically indicated. No acute fracture of the cervical spine. Electronically Signed   By: Elgie Collard M.D.   On: 06/20/2015 01:39   I have personally reviewed and evaluated these images and lab results as part of my medical decision-making.   EKG Interpretation   Date/Time:  Tuesday June 19 2015 17:48:47 EST Ventricular Rate:  82 PR Interval:  147 QRS Duration: 79 QT Interval:  367 QTC Calculation: 429 R Axis:   16 Text Interpretation:  Sinus rhythm Minimal ST depression, lateral leads ED  PHYSICIAN INTERPRETATION AVAILABLE IN [facility] Confirmed by TEST,  Record ([id number]) on 06/20/2015 7:22:36 AM      MDM   Final diagnoses:  Acute encephalopathy    81yo female with history of dementia who presents with concern for frequent falls towards the right over the last 3 days. Regarding falls, had negative CT head/CSPine, no other clear areas of tenderness on exam and low suspicion for injuries.  Labs, CT without acute findings. Urinalysis questionable for UTI and ordered blood cx and rocephin for treatment.  Patient has symmetric strength in upper extremities, however does not follow commands and assessment of lower extremity strength and coordination is limited. Given history provided by family, have concern for possible stroke.  Discussed with hospitalist and consulted Neurology. Pt to be admitted to [facility] to have evaluation with MR/Neuro consult.   I personally performed the services described in this documentation, which was scribed in my presence.  The recorded information has been reviewed and is accurate.    Diana Monday, MD 06/20/15 2012

## 2015-06-19 NOTE — ED Notes (Signed)
Patient presents from home for weakness, falls, unable to ambulate, loss of appetite x2-3 days. Last BM x3 days. Patient alert to self only. History of dementia. Unable to complete neuro exam due to confusion.

## 2015-06-20 ENCOUNTER — Emergency Department (HOSPITAL_COMMUNITY): Payer: Medicare Other

## 2015-06-20 ENCOUNTER — Inpatient Hospital Stay (HOSPITAL_COMMUNITY): Payer: Medicare Other

## 2015-06-20 ENCOUNTER — Encounter (HOSPITAL_COMMUNITY): Payer: Self-pay | Admitting: Internal Medicine

## 2015-06-20 DIAGNOSIS — E876 Hypokalemia: Secondary | ICD-10-CM | POA: Diagnosis present

## 2015-06-20 DIAGNOSIS — R296 Repeated falls: Secondary | ICD-10-CM

## 2015-06-20 DIAGNOSIS — N182 Chronic kidney disease, stage 2 (mild): Secondary | ICD-10-CM | POA: Diagnosis present

## 2015-06-20 DIAGNOSIS — R410 Disorientation, unspecified: Secondary | ICD-10-CM | POA: Diagnosis present

## 2015-06-20 DIAGNOSIS — D72819 Decreased white blood cell count, unspecified: Secondary | ICD-10-CM | POA: Diagnosis present

## 2015-06-20 DIAGNOSIS — I10 Essential (primary) hypertension: Secondary | ICD-10-CM | POA: Diagnosis not present

## 2015-06-20 DIAGNOSIS — Z66 Do not resuscitate: Secondary | ICD-10-CM | POA: Diagnosis present

## 2015-06-20 DIAGNOSIS — F0390 Unspecified dementia without behavioral disturbance: Secondary | ICD-10-CM | POA: Diagnosis not present

## 2015-06-20 DIAGNOSIS — Z8249 Family history of ischemic heart disease and other diseases of the circulatory system: Secondary | ICD-10-CM | POA: Diagnosis not present

## 2015-06-20 DIAGNOSIS — N3000 Acute cystitis without hematuria: Secondary | ICD-10-CM | POA: Diagnosis not present

## 2015-06-20 DIAGNOSIS — G9340 Encephalopathy, unspecified: Principal | ICD-10-CM

## 2015-06-20 DIAGNOSIS — I129 Hypertensive chronic kidney disease with stage 1 through stage 4 chronic kidney disease, or unspecified chronic kidney disease: Secondary | ICD-10-CM | POA: Diagnosis present

## 2015-06-20 DIAGNOSIS — E785 Hyperlipidemia, unspecified: Secondary | ICD-10-CM | POA: Diagnosis present

## 2015-06-20 DIAGNOSIS — D649 Anemia, unspecified: Secondary | ICD-10-CM | POA: Diagnosis present

## 2015-06-20 DIAGNOSIS — N390 Urinary tract infection, site not specified: Secondary | ICD-10-CM | POA: Diagnosis present

## 2015-06-20 LAB — COMPREHENSIVE METABOLIC PANEL
ALK PHOS: 68 U/L (ref 38–126)
ALT: 14 U/L (ref 14–54)
AST: 18 U/L (ref 15–41)
Albumin: 3.8 g/dL (ref 3.5–5.0)
Anion gap: 10 (ref 5–15)
BUN: 19 mg/dL (ref 6–20)
CALCIUM: 8.7 mg/dL — AB (ref 8.9–10.3)
CHLORIDE: 102 mmol/L (ref 101–111)
CO2: 29 mmol/L (ref 22–32)
CREATININE: 0.88 mg/dL (ref 0.44–1.00)
GFR calc non Af Amer: >60 mL/min (ref >60)
GLUCOSE: 161 mg/dL — AB (ref 65–99)
Potassium: 3.1 mmol/L — ABNORMAL LOW (ref 3.5–5.1)
SODIUM: 141 mmol/L (ref 135–145)
Total Bilirubin: 0.8 mg/dL (ref 0.3–1.2)
Total Protein: 6.9 g/dL (ref 6.5–8.1)

## 2015-06-20 LAB — CBC
HCT: 35.9 % — ABNORMAL LOW (ref 36.0–46.0)
HEMOGLOBIN: 11.2 g/dL — AB (ref 12.0–15.0)
MCH: 26.3 pg (ref 26.0–34.0)
MCHC: 31.2 g/dL (ref 30.0–36.0)
MCV: 84.3 fL (ref 78.0–100.0)
PLATELETS: 197 10*3/uL (ref 150–400)
RBC: 4.26 MIL/uL (ref 3.87–5.11)
RDW: 14.6 % (ref 11.5–15.5)
WBC: 3.3 10*3/uL — AB (ref 4.0–10.5)

## 2015-06-20 LAB — URINE MICROSCOPIC-ADD ON

## 2015-06-20 LAB — URINALYSIS, ROUTINE W REFLEX MICROSCOPIC
BILIRUBIN URINE: NEGATIVE
Glucose, UA: NEGATIVE mg/dL
Ketones, ur: NEGATIVE mg/dL
NITRITE: NEGATIVE
PH: 7.5 (ref 5.0–8.0)
Protein, ur: NEGATIVE mg/dL
SPECIFIC GRAVITY, URINE: 1.007 (ref 1.005–1.030)

## 2015-06-20 LAB — I-STAT CG4 LACTIC ACID, ED
LACTIC ACID, VENOUS: 0.87 mmol/L (ref 0.5–2.0)
LACTIC ACID, VENOUS: 1.11 mmol/L (ref 0.5–2.0)

## 2015-06-20 MED ORDER — ASPIRIN 300 MG RE SUPP
300.0000 mg | Freq: Every day | RECTAL | Status: DC
  Filled 2015-06-20 (×2): qty 1

## 2015-06-20 MED ORDER — LORAZEPAM 0.5 MG PO TABS
0.5000 mg | ORAL_TABLET | Freq: Two times a day (BID) | ORAL | Status: DC | PRN
  Administered 2015-06-23: 0.5 mg via ORAL
  Filled 2015-06-20 (×2): qty 1

## 2015-06-20 MED ORDER — ASPIRIN 325 MG PO TABS
325.0000 mg | ORAL_TABLET | Freq: Every day | ORAL | Status: DC
  Administered 2015-06-20 – 2015-06-21 (×2): 325 mg via ORAL
  Filled 2015-06-20 (×2): qty 1

## 2015-06-20 MED ORDER — SERTRALINE HCL 50 MG PO TABS
75.0000 mg | ORAL_TABLET | Freq: Every day | ORAL | Status: DC
  Administered 2015-06-20 – 2015-06-23 (×4): 75 mg via ORAL
  Filled 2015-06-20: qty 2
  Filled 2015-06-20 (×4): qty 1

## 2015-06-20 MED ORDER — TRAZODONE HCL 50 MG PO TABS
50.0000 mg | ORAL_TABLET | Freq: Every day | ORAL | Status: DC
  Administered 2015-06-20 – 2015-06-22 (×3): 50 mg via ORAL
  Filled 2015-06-20 (×3): qty 1

## 2015-06-20 MED ORDER — POTASSIUM CHLORIDE CRYS ER 20 MEQ PO TBCR
40.0000 meq | EXTENDED_RELEASE_TABLET | Freq: Once | ORAL | Status: AC
  Administered 2015-06-20: 40 meq via ORAL
  Filled 2015-06-20: qty 2

## 2015-06-20 MED ORDER — SENNOSIDES-DOCUSATE SODIUM 8.6-50 MG PO TABS: 1.0000 | ORAL_TABLET | Freq: Every evening | ORAL | Status: DC | PRN

## 2015-06-20 MED ORDER — ENOXAPARIN SODIUM 40 MG/0.4ML ~~LOC~~ SOLN
40.0000 mg | SUBCUTANEOUS | Status: DC
Start: 2015-06-20 — End: 2015-06-23
  Administered 2015-06-20 – 2015-06-22 (×3): 40 mg via SUBCUTANEOUS
  Filled 2015-06-20 (×4): qty 0.4

## 2015-06-20 MED ORDER — DEXTROSE 5 % IV SOLN
1.0000 g | INTRAVENOUS | Status: DC
  Administered 2015-06-21 – 2015-06-23 (×3): 1 g via INTRAVENOUS
  Filled 2015-06-20 (×3): qty 10

## 2015-06-20 MED ORDER — DONEPEZIL HCL 10 MG PO TABS
10.0000 mg | ORAL_TABLET | Freq: Every day | ORAL | Status: DC
  Administered 2015-06-20 – 2015-06-22 (×3): 10 mg via ORAL
  Filled 2015-06-20 (×3): qty 1

## 2015-06-20 MED ORDER — EZETIMIBE 10 MG PO TABS
10.0000 mg | ORAL_TABLET | Freq: Every day | ORAL | Status: DC
  Administered 2015-06-20 – 2015-06-22 (×3): 10 mg via ORAL
  Filled 2015-06-20 (×3): qty 1

## 2015-06-20 MED ORDER — VITAMIN D 1000 UNITS PO TABS
1000.0000 [IU] | ORAL_TABLET | Freq: Every day | ORAL | Status: DC
  Administered 2015-06-20 – 2015-06-23 (×4): 1000 [IU] via ORAL
  Filled 2015-06-20 (×5): qty 1

## 2015-06-20 MED ORDER — FAMOTIDINE 20 MG PO TABS
40.0000 mg | ORAL_TABLET | Freq: Every day | ORAL | Status: DC
  Administered 2015-06-20 – 2015-06-23 (×4): 40 mg via ORAL
  Filled 2015-06-20 (×4): qty 1
  Filled 2015-06-20: qty 2

## 2015-06-20 MED ORDER — LORAZEPAM 2 MG/ML IJ SOLN
1.0000 mg | Freq: Once | INTRAMUSCULAR | Status: AC
  Administered 2015-06-20: 1 mg via INTRAVENOUS
  Filled 2015-06-20: qty 1

## 2015-06-20 MED ORDER — DEXTROSE 5 % IV SOLN
1.0000 g | Freq: Once | INTRAVENOUS | Status: AC
  Administered 2015-06-20: 1 g via INTRAVENOUS
  Filled 2015-06-20: qty 10

## 2015-06-20 NOTE — Evaluation (Signed)
Clinical/Bedside Swallow Evaluation Patient Details  Name: Diana Allison MRN: [id number] Date of Birth: Sep 07, 1932  Today's Date: 06/20/2015 Time: SLP Start Time (ACUTE ONLY): 1050 SLP Stop Time (ACUTE ONLY): 1116 SLP Time Calculation (min) (ACUTE ONLY): 26 min  Past Medical History:  Past Medical History  Diagnosis Date  . Hyperlipidemia   . Hypertension   . Cataract   . Dementia    Past Surgical History:  Past Surgical History  Procedure Laterality Date  . Knee surgery    . Vaginal hysterectomy     HPI:  81 yo female adm to [facility] with AMS, decreased ability to ambulate and stand.  Pt resides with family and has h/o dementia, HTN, HLD, cataract, falls.  Pt CXR negative.  CT head negative, MRI pending.  Daughter present reports pt requires someone to feed her and she takes her medications with yogurt crushed at home.    Assessment / Plan / Recommendation Clinical Impression  Pt presents with dysphagia consistent with deficits associated with dementia.  Pt requires feeding but did accept po intake offered.  Hand over hand assist not helpful for motor skills associated with eating during this evaluation.  Small boluses accepted with vertical mastication pattern noted with cracker.   Suspect piecemeal deglutition present with liquids as multiple swallows noted.  No overt indication of airway compromise.  Cough x1 at completion of snack noted which family reports pt has baseline cough not associated with po intake  = h/o second hand smoke exposure.  Baseline oral dysphagia apparent per SLP interview with family - She used to pocket foods per daughter (but no longer) and takes medications crushed with yogurt.  Recommend pt have regular/thin diet with precautions. Educated family to general precautions and gustatory changes associated with dementia progression.  Will sign off as all education completed, please reorder if indicated.     Aspiration Risk  Mild aspiration risk    Diet  Recommendation Regular;Thin liquid   Liquid Administration via: Straw;Cup Medication Administration: Crushed with puree Supervision: Full supervision/cueing for compensatory strategies;Comment (pt must be fed - per family she does not feed herself ) Compensations: Minimize environmental distractions;Slow rate;Small sips/bites;Other (Comment) (check for oral residuals as pt allows) Postural Changes: Seated upright at 90 degrees;Remain upright for at least 30 minutes after po intake    Other  Recommendations Oral Care Recommendations: Oral care BID   Follow up Recommendations    n/a   Frequency and Duration   n/a         Prognosis   n/a     Swallow Study   General Date of Onset: 06/20/15 HPI: 81 yo female adm to [facility] with AMS, decreased ability to ambulate and stand.  Pt resides with family and has h/o dementia, HTN, HLD, cataract, falls.  Pt CXR negative.  CT head negative, MRI pending.  Daughter present reports pt requires someone to feed her and she takes her medications with yogurt crushed at home.  Type of Study: Bedside Swallow Evaluation Diet Prior to this Study: NPO Temperature Spikes Noted: No Respiratory Status: Room air History of Recent Intubation: No Behavior/Cognition: Alert;Doesn't follow directions (pt has dementia, does not follow directions) Oral Cavity Assessment: Other (comment) (pt did not open mouth adequately to view, however with minimal opening appeared clear/moist) Oral Care Completed by SLP: No Oral Cavity - Dentition: Adequate natural dentition Vision:  (pt needs to be fed per family) Self-Feeding Abilities: Total assist (family places food in pt's mouth ) Patient Positioning: Upright in  chair Baseline Vocal Quality: Normal Volitional Cough: Cognitively unable to elicit Volitional Swallow: Unable to elicit    Oral/Motor/Sensory Function Overall Oral Motor/Sensory Function: Other (comment) (pt did not follow commands but no facial asymmetry noted and  articulation was clear)   Ice Chips Ice chips: Not tested   Thin Liquid Thin Liquid: Impaired Presentation: Straw;Cup Oral Phase Functional Implications: Prolonged oral transit Pharyngeal  Phase Impairments: Multiple swallows Other Comments: suspect multiple swallows due to piecemealing and not pharyngeal deficits    Nectar Thick Nectar Thick Liquid: Not tested   Honey Thick Honey Thick Liquid: Not tested   Puree Puree: Impaired Presentation: Spoon Oral Phase Impairments: Reduced lingual movement/coordination Oral Phase Functional Implications: Prolonged oral transit Other Comments: pt accepted small bolus only, delayed oral transiting, suspect displeasure with applesauce contributing factor   Solid Solid: Impaired Oral Phase Impairments: Reduced lingual movement/coordination;Impaired mastication Oral Phase Functional Implications: Impaired mastication (vertical mastication pattern consistent with cognitive deficit)       Mills KollerKimball, Khylah Kendra Ann Trebor Galdamez, MS [facility] SLP [phone]

## 2015-06-20 NOTE — Progress Notes (Addendum)
PROGRESS NOTE    Diana Allison UJW:[id number] DOB: 1933/02/09 DOA: 06/19/2015 PCP: Johny BlamerHARRIS, WILLIAM, MD  HPI/Brief narrative 81 year old female with history of advanced dementia, HTN, HLD, presented to the ED on 06/19/15 with worsening confusion over the last 3 days, difficulty walking with leaning to the right and frequent falls. Patient was advised to come to the ED by PCP. In the ED, CT head and neck unremarkable. Admitted for evaluation and management of acute on chronic encephalopathy.   Assessment/Plan:  Acute encephalopathy complicating advanced dementia - Etiology not entirely clear. No fevers reported. Urine microscopy does show a few bacteria, negative nitrite and 6-30 WBCs-? UTI. Chest x-ray: No acute cardiopulmonary disease. CT head and neck without acute findings. Other DD: CVA. Progressing dementia - Start IV Rocephin after urine culture has been sent. - Checking MRI brain and if positive-start aspirin and therapies but no further evaluation as discussed by admitting M.D. with family.  Essential hypertension - Uncontrolled and fluctuating. Antihypertensives discontinued prior to admission due to orthostatic changes.   Hyperlipidemia - Continue Zetia  Stage II chronic kidney disease - Creatinine at baseline  Severe dementia - Mental status as discussed above  Anemia  - stable  Hypokalemia - replace and follow.   DVT prophylaxis: Lovenox  Code Status: DO NOT RESUSCITATE  Family Communication: Discussed with patient's daughter Ms. Quitman SinkRita and granddaughter at bedside in ED on 12/28 Disposition Plan: To be determined    Consultants:  Neurology   Procedures:  None   Antibiotics:  IV Rocephin 12/28 >   Subjective: Patient pleasantly confused and mostly nonverbal. Oriented only to self.  Objective: Filed Vitals:   06/20/15 0700 06/20/15 0743 06/20/15 1124 06/20/15 1222  BP: 159/97 159/111 145/111 160/75  Pulse: 72 72 88 79  Temp:    98.1 F (36.7  C)  TempSrc:    Oral  Resp: 20 15 16 18   Height:    5' (1.524 m)  Weight:    58.06 kg (128 lb)  SpO2: 95% 92% 95% 97%   No intake or output data in the 24 hours ending 06/20/15 1514 Filed Weights   06/20/15 1222  Weight: 58.06 kg (128 lb)     Exam:  General exam: elderly female lying comfortably in bed Respiratory system: Clear. No increased work of breathing. Cardiovascular system: S1 & S2 heard, RRR. No JVD, murmurs, gallops, clicks or pedal edema. Gastrointestinal system: Abdomen is nondistended, soft and nontender. Normal bowel sounds heard. Central nervous system: Alert and oriented only to self. No focal neurological deficits. Extremities: Symmetric 5 x 5 power.   Data Reviewed: Basic Metabolic Panel:  Recent Labs Lab 06/19/15 1826 06/20/15 1312  NA 143 141  K 3.2* 3.1*  CL 103 102  CO2 30 29  GLUCOSE 125* 161*  BUN 29* 19  CREATININE 1.08* 0.88  CALCIUM 9.0 8.7*   Liver Function Tests:  Recent Labs Lab 06/20/15 1312  AST 18  ALT 14  ALKPHOS 68  BILITOT 0.8  PROT 6.9  ALBUMIN 3.8   No results for input(s): LIPASE, AMYLASE in the last 168 hours. No results for input(s): AMMONIA in the last 168 hours. CBC:  Recent Labs Lab 06/19/15 1826 06/20/15 1312  WBC 4.6 3.3*  HGB 11.5* 11.2*  HCT 36.5 35.9*  MCV 84.7 84.3  PLT 221 197   Cardiac Enzymes: No results for input(s): CKTOTAL, CKMB, CKMBINDEX, TROPONINI in the last 168 hours. BNP (last 3 results) No results for input(s): PROBNP in the last  8760 hours. CBG:  Recent Labs Lab 06/19/15 2328  GLUCAP 99    Recent Results (from the past 240 hour(s))  Blood culture (routine x 2)     Status: None (Preliminary result)   Collection Time: 06/20/15  4:39 AM  Result Value Ref Range Status   Specimen Description BLOOD RIGHT FOREARM  Final   Special Requests BOTTLES DRAWN AEROBIC AND ANAEROBIC 5CC  Final   Culture PENDING  Incomplete   Report Status PENDING  Incomplete            Studies: Dg Chest 2 View  06/20/2015  CLINICAL DATA:  81 year old female with increasing weakness an multiple falls. EXAM: CHEST  2 VIEW COMPARISON:  Radiograph dated 04/13/2015 FINDINGS: Two views of the chest do not demonstrate a focal consolidation. There is no pleural effusion or pneumothorax. Stable cardiac silhouette. There is severe osteopenia with degenerative changes of the spine. IMPRESSION: No active cardiopulmonary disease. Electronically Signed   By: Elgie Collard M.D.   On: 06/20/2015 01:12   Ct Head Wo Contrast  06/20/2015  CLINICAL DATA:  81 year old female with right-sided weakness and frequent falls. EXAM: CT HEAD WITHOUT CONTRAST CT CERVICAL SPINE WITHOUT CONTRAST TECHNIQUE: Multidetector CT imaging of the head and cervical spine was performed following the standard protocol without intravenous contrast. Multiplanar CT image reconstructions of the cervical spine were also generated. COMPARISON:  Head CT dated 04/13/2015 and study dated 03/28/2014 FINDINGS: CT HEAD FINDINGS The ventricles are dilated and the sulci are prominent compatible with age-related atrophy. Periventricular and deep white matter hypodensities represent chronic microvascular ischemic changes. There is no intracranial hemorrhage. No mass effect or midline shift identified. Small pockets of gas noted in the skullbase and in the region of the cavernous sinus, likely iatrogenic and related to IV access. The visualized paranasal sinuses and mastoid air cells are well aerated. The calvarium is intact. CT CERVICAL SPINE FINDINGS Evaluation of this exam is limited due to motion artifact. There is no definite acute fracture or subluxation of the cervical spine.There is osteopenia with multilevel degenerative changes. There is a 1 cm lytic appear lesion involving the left aspect of the vertebral body and left pedicle of the C5 similar to prior study dated 03/28/2014 likely related to expanded transverse foramen  due to tortuous vessel. The odontoid and spinous processes are intact.There is normal anatomic alignment of the C1-C2 lateral masses. The visualized soft tissues appear unremarkable. IMPRESSION: No acute intracranial hemorrhage. Age-related atrophy and chronic microvascular ischemic disease. If symptoms persist and there are no contraindications, MRI may provide better evaluation if clinically indicated. No acute fracture of the cervical spine. Electronically Signed   By: Elgie Collard M.D.   On: 06/20/2015 01:39   Ct Cervical Spine Wo Contrast  06/20/2015  CLINICAL DATA:  81 year old female with right-sided weakness and frequent falls. EXAM: CT HEAD WITHOUT CONTRAST CT CERVICAL SPINE WITHOUT CONTRAST TECHNIQUE: Multidetector CT imaging of the head and cervical spine was performed following the standard protocol without intravenous contrast. Multiplanar CT image reconstructions of the cervical spine were also generated. COMPARISON:  Head CT dated 04/13/2015 and study dated 03/28/2014 FINDINGS: CT HEAD FINDINGS The ventricles are dilated and the sulci are prominent compatible with age-related atrophy. Periventricular and deep white matter hypodensities represent chronic microvascular ischemic changes. There is no intracranial hemorrhage. No mass effect or midline shift identified. Small pockets of gas noted in the skullbase and in the region of the cavernous sinus, likely iatrogenic and related to IV access. The  visualized paranasal sinuses and mastoid air cells are well aerated. The calvarium is intact. CT CERVICAL SPINE FINDINGS Evaluation of this exam is limited due to motion artifact. There is no definite acute fracture or subluxation of the cervical spine.There is osteopenia with multilevel degenerative changes. There is a 1 cm lytic appear lesion involving the left aspect of the vertebral body and left pedicle of the C5 similar to prior study dated 03/28/2014 likely related to expanded transverse foramen  due to tortuous vessel. The odontoid and spinous processes are intact.There is normal anatomic alignment of the C1-C2 lateral masses. The visualized soft tissues appear unremarkable. IMPRESSION: No acute intracranial hemorrhage. Age-related atrophy and chronic microvascular ischemic disease. If symptoms persist and there are no contraindications, MRI may provide better evaluation if clinically indicated. No acute fracture of the cervical spine. Electronically Signed   By: Elgie Collard M.D.   On: 06/20/2015 01:39        Scheduled Meds: Continuous Infusions:  Principal Problem:   Acute encephalopathy Active Problems:   Severe dementia   Hyperlipidemia    Time spent: 30 minutes.    Marcellus Scott, MD, FACP, FHM. [facility] Pager [phone]  If 7PM-7AM, please contact night-coverage [email or web address] Password [email or web address] 06/20/2015, 3:14 PM    LOS: 0 days

## 2015-06-20 NOTE — ED Notes (Signed)
Per Hospitalist, consult speech therapy

## 2015-06-20 NOTE — ED Notes (Signed)
Patient's daughter voice frustration in regards to patient not being able to eat or drink at this time. It was explained to her that the patient did not pass the pre-screening for swallowing and the patient must now be evaluated by speech to ensure safety with intake. The daughter states that she has been giving the patient drink. The risk for aspiration was explained with the understanding that the patient does have dementia and sometimes refuses to follow commands. Daughter made aware that we could not give her anything by mouth until she is evaluated. Speech has been paged.

## 2015-06-20 NOTE — Consult Note (Signed)
Referring Physician: [facility]    Chief Complaint: recurrent falls, leaning to the right.  HPI:                                                                                                                                         Diana Allison is an 81 y.o. femalewith a past medical history significant for HTN, HLD, and advanced dementia, brought in by family for evaluation of the aforementioned symptoms. Family is at the bedside and expressed that " this a significant change that started just 5 days ago, as she has been able to walk around the house without a problem" According to patient daughter, patient can not stand up and walk because she is always leaning to the right and then falls. She said that her mother has no complains before falling and she hasn't seen alteration of consciousness or abnormal movements while she is lying in the floor. Daughter reports that patient had loss of bladder control once. CT brain was personally reviewed and showed no acute abnormality. Serologies and UA are unrevealing. Presently, she is alert and awake but doesn't follow commands.  Date last known well: unable to determine Time last known well: unable to determine tPA Given: no, late pesentation   Past Medical History  Diagnosis Date  . Hyperlipidemia   . Hypertension   . Cataract   . Dementia     Past Surgical History  Procedure Laterality Date  . Knee surgery    . Vaginal hysterectomy      Family History  Problem Relation Age of Onset  . Dementia Neg Hx   . Hypertension Daughter    Social History:  reports that she has never smoked. She has never used smokeless tobacco. She reports that she does not drink alcohol or use illicit drugs. Family history: no epilepsy, MS, or brain tumor Allergies: No Known Allergies  Medications:                                                                                                                           I have reviewed the patient's current  medications.  ROS: unable to obtain due to advanced dementia  History obtained from family and chart review    Physical exam:  Constitutional: well developed, pleasant female in no apparent distress. Blood pressure 159/111, pulse 72, temperature 98.4 F (36.9 C), temperature source Oral, resp. rate 15, SpO2 92 %. Eyes: no jaundice or exophthalmos.  Head: normocephalic. Neck: supple, no bruits, no JVD. Cardiac: no murmurs. Lungs: clear. Abdomen: soft, no tender, no mass. Extremities: no edema, clubbing, or cyanosis.  Skin: no rash  Neurologic Examination:                                                                                                      General: NAD Mental Status: Alert and awake, she mimics at times but for the most part not able to follow commands. Very limited verbal output Cranial Nerves: II: Discs flat bilaterally; Visual fields grossly normal, pupils equal, round, reactive to light and accommodation III,IV, VI: ptosis not present, extra-ocular motions intact bilaterally V,VII: smile symmetric, facial light touch sensation normal bilaterally VIII: hearing normal bilaterally IX,X: uvula rises symmetrically XI: bilateral shoulder shrug no tested XII: midline tongue extension without atrophy or fasciculations Motor: Moves all limbs symmetrically Tone and bulk:normal tone throughout; no atrophy noted Sensory: reacts to noxious stimuli Deep Tendon Reflexes:  1 all over Plantars: Right: downgoing   Left: downgoing Cerebellar: Unable to test due to patient inability to follow commands  Gait:  No tested due to multiple leads    Results for orders placed or performed during the hospital encounter of 06/19/15 (from the past 48 hour(s))  Basic metabolic panel     Status: Abnormal   Collection Time: 06/19/15  6:26 PM  Result  Value Ref Range   Sodium 143 135 - 145 mmol/L   Potassium 3.2 (L) 3.5 - 5.1 mmol/L   Chloride 103 101 - 111 mmol/L   CO2 30 22 - 32 mmol/L   Glucose, Bld 125 (H) 65 - 99 mg/dL   BUN 29 (H) 6 - 20 mg/dL   Creatinine, Ser 1.08 (H) 0.44 - 1.00 mg/dL   Calcium 9.0 8.9 - 10.3 mg/dL   GFR calc non Af Amer 47 (L) >60 mL/min   GFR calc Af Amer 55 (L) >60 mL/min    Comment: (NOTE) The eGFR has been calculated using the CKD EPI equation. This calculation has not been validated in all clinical situations. eGFR's persistently <60 mL/min signify possible Chronic Kidney Disease.    Anion gap 10 5 - 15  CBC     Status: Abnormal   Collection Time: 06/19/15  6:26 PM  Result Value Ref Range   WBC 4.6 4.0 - 10.5 K/uL   RBC 4.31 3.87 - 5.11 MIL/uL   Hemoglobin 11.5 (L) 12.0 - 15.0 g/dL   HCT 36.5 36.0 - 46.0 %   MCV 84.7 78.0 - 100.0 fL   MCH 26.7 26.0 - 34.0 pg   MCHC 31.5 30.0 - 36.0 g/dL   RDW 14.5 11.5 - 15.5 %   Platelets 221 150 - 400 K/uL  CBG monitoring, ED     Status: None   Collection  Time: 06/19/15 11:28 PM  Result Value Ref Range   Glucose-Capillary 99 65 - 99 mg/dL  I-Stat CG4 Lactic Acid, ED     Status: None   Collection Time: 06/20/15 12:39 AM  Result Value Ref Range   Lactic Acid, Venous 0.87 0.5 - 2.0 mmol/L  Urinalysis, Routine w reflex microscopic (not at [facility])     Status: Abnormal   Collection Time: 06/20/15  1:44 AM  Result Value Ref Range   Color, Urine YELLOW YELLOW   APPearance CLOUDY (A) CLEAR   Specific Gravity, Urine 1.007 1.005 - 1.030   pH 7.5 5.0 - 8.0   Glucose, UA NEGATIVE NEGATIVE mg/dL   Hgb urine dipstick TRACE (A) NEGATIVE   Bilirubin Urine NEGATIVE NEGATIVE   Ketones, ur NEGATIVE NEGATIVE mg/dL   Protein, ur NEGATIVE NEGATIVE mg/dL   Nitrite NEGATIVE NEGATIVE   Leukocytes, UA MODERATE (A) NEGATIVE  Urine microscopic-add on     Status: Abnormal   Collection Time: 06/20/15  1:44 AM  Result Value Ref Range   Squamous Epithelial / LPF 6-30 (A) NONE  SEEN   WBC, UA 6-30 0 - 5 WBC/hpf   RBC / HPF 0-5 0 - 5 RBC/hpf   Bacteria, UA FEW (A) NONE SEEN  I-Stat CG4 Lactic Acid, ED     Status: None   Collection Time: 06/20/15  3:17 AM  Result Value Ref Range   Lactic Acid, Venous 1.11 0.5 - 2.0 mmol/L  Blood culture (routine x 2)     Status: None (Preliminary result)   Collection Time: 06/20/15  4:39 AM  Result Value Ref Range   Specimen Description BLOOD RIGHT FOREARM    Special Requests BOTTLES DRAWN AEROBIC AND ANAEROBIC 5CC    Culture PENDING    Report Status PENDING    Dg Chest 2 View  06/20/2015  CLINICAL DATA:  81 year old female with increasing weakness an multiple falls. EXAM: CHEST  2 VIEW COMPARISON:  Radiograph dated 04/13/2015 FINDINGS: Two views of the chest do not demonstrate a focal consolidation. There is no pleural effusion or pneumothorax. Stable cardiac silhouette. There is severe osteopenia with degenerative changes of the spine. IMPRESSION: No active cardiopulmonary disease. Electronically Signed   By: Anner Crete M.D.   On: 06/20/2015 01:12   Ct Head Wo Contrast  06/20/2015  CLINICAL DATA:  81 year old female with right-sided weakness and frequent falls. EXAM: CT HEAD WITHOUT CONTRAST CT CERVICAL SPINE WITHOUT CONTRAST TECHNIQUE: Multidetector CT imaging of the head and cervical spine was performed following the standard protocol without intravenous contrast. Multiplanar CT image reconstructions of the cervical spine were also generated. COMPARISON:  Head CT dated 04/13/2015 and study dated 03/28/2014 FINDINGS: CT HEAD FINDINGS The ventricles are dilated and the sulci are prominent compatible with age-related atrophy. Periventricular and deep white matter hypodensities represent chronic microvascular ischemic changes. There is no intracranial hemorrhage. No mass effect or midline shift identified. Small pockets of gas noted in the skullbase and in the region of the cavernous sinus, likely iatrogenic and related to IV  access. The visualized paranasal sinuses and mastoid air cells are well aerated. The calvarium is intact. CT CERVICAL SPINE FINDINGS Evaluation of this exam is limited due to motion artifact. There is no definite acute fracture or subluxation of the cervical spine.There is osteopenia with multilevel degenerative changes. There is a 1 cm lytic appear lesion involving the left aspect of the vertebral body and left pedicle of the C5 similar to prior study dated 03/28/2014 likely related to  expanded transverse foramen due to tortuous vessel. The odontoid and spinous processes are intact.There is normal anatomic alignment of the C1-C2 lateral masses. The visualized soft tissues appear unremarkable. IMPRESSION: No acute intracranial hemorrhage. Age-related atrophy and chronic microvascular ischemic disease. If symptoms persist and there are no contraindications, MRI may provide better evaluation if clinically indicated. No acute fracture of the cervical spine. Electronically Signed   By: Anner Crete M.D.   On: 06/20/2015 01:39   Ct Cervical Spine Wo Contrast  06/20/2015  CLINICAL DATA:  81 year old female with right-sided weakness and frequent falls. EXAM: CT HEAD WITHOUT CONTRAST CT CERVICAL SPINE WITHOUT CONTRAST TECHNIQUE: Multidetector CT imaging of the head and cervical spine was performed following the standard protocol without intravenous contrast. Multiplanar CT image reconstructions of the cervical spine were also generated. COMPARISON:  Head CT dated 04/13/2015 and study dated 03/28/2014 FINDINGS: CT HEAD FINDINGS The ventricles are dilated and the sulci are prominent compatible with age-related atrophy. Periventricular and deep white matter hypodensities represent chronic microvascular ischemic changes. There is no intracranial hemorrhage. No mass effect or midline shift identified. Small pockets of gas noted in the skullbase and in the region of the cavernous sinus, likely iatrogenic and related to IV  access. The visualized paranasal sinuses and mastoid air cells are well aerated. The calvarium is intact. CT CERVICAL SPINE FINDINGS Evaluation of this exam is limited due to motion artifact. There is no definite acute fracture or subluxation of the cervical spine.There is osteopenia with multilevel degenerative changes. There is a 1 cm lytic appear lesion involving the left aspect of the vertebral body and left pedicle of the C5 similar to prior study dated 03/28/2014 likely related to expanded transverse foramen due to tortuous vessel. The odontoid and spinous processes are intact.There is normal anatomic alignment of the C1-C2 lateral masses. The visualized soft tissues appear unremarkable. IMPRESSION: No acute intracranial hemorrhage. Age-related atrophy and chronic microvascular ischemic disease. If symptoms persist and there are no contraindications, MRI may provide better evaluation if clinically indicated. No acute fracture of the cervical spine. Electronically Signed   By: Anner Crete M.D.   On: 06/20/2015 01:39     Assessment: 81 y.o. female with advanced dementia, brought in due to new onset recurrent falls and leaning to the right for the past 5 days. Her neuro-exam is compromised by her lack to follow commands and participate on exam as result of advanced dementia. Differential include acute cerebral dysfunction in the context of stroke versus progression of underlying dementia. MRI brain already requested. PT. Will continue to follow.  Stroke Risk Factors -age, HTN, HLD    Dorian Pod, MD [facility] [phone]  06/20/2015, 10:12 AM

## 2015-06-20 NOTE — ED Notes (Addendum)
Pt uncooperative during drinking portion of swallow screen. Pts daughter states pt is stubborn when attempting to get her to eat and drink. Pt has hx of dementia. Pt began cough during attempting to utilize straw.

## 2015-06-20 NOTE — ED Notes (Signed)
Pt can go up at 11:48am

## 2015-06-20 NOTE — ED Notes (Signed)
Speech Therapy at bedside

## 2015-06-20 NOTE — ED Notes (Signed)
Unsuccessful attempt at getting urine. Will try for 2nd attempt after the pt has had fluids.

## 2015-06-20 NOTE — H&P (Signed)
[facility] History and Physical  Diana Allison ZOX:[id number] DOB: 13-Jan-1933 DOA: 06/19/2015  Referring physician: Dr.Scholssman. PCP: Johny Blamer, MD  Specialists: None.  Chief Complaint: Increasing confusion.  HPI: Diana Allison is a 81 y.o. female with history of severe dementia was brought to the ER after patient was found to have increasing confusion over the last 3 days and patient has been having difficulty walking with patient falling towards the right side. Patient did not have any nausea vomiting or diarrhea did not complain of any chest pain or shortness of breath. Patient has dementia and most of the history was obtained from patient's daughter. CT of the head and neck unremarkable. Patient follows commands and moves all extremities. At this time patient to be admitted for further management of patient's increasing confusion and frequent falls.   Review of Systems: As presented in the history of presenting illness, rest negative.  Past Medical History  Diagnosis Date  . Hyperlipidemia   . Hypertension   . Cataract   . Dementia    Past Surgical History  Procedure Laterality Date  . Knee surgery    . Vaginal hysterectomy     Social History:  reports that she has never smoked. She has never used smokeless tobacco. She reports that she does not drink alcohol or use illicit drugs. Where does patient live home. Can patient participate in ADLs? No.  No Known Allergies  Family History:  Family History  Problem Relation Age of Onset  . Dementia Neg Hx   . Hypertension Daughter       Prior to Admission medications   Medication Sig Start Date End Date Taking? Authorizing Provider  aspirin EC 81 MG tablet Take 81 mg by mouth daily.   Yes Historical Provider, MD  cholecalciferol (VITAMIN D) 1000 units tablet Take 1,000 Units by mouth daily.   Yes Historical Provider, MD  COCONUT OIL PO Take 2 capsules by mouth 2 (two) times daily.   Yes Historical  Provider, MD  donepezil (ARICEPT) 10 MG tablet Take 10 mg by mouth at bedtime.  06/30/14  Yes Historical Provider, MD  ezetimibe (ZETIA) 10 MG tablet Take 10 mg by mouth at bedtime.    Yes Historical Provider, MD  Multiple Vitamin (MULTIVITAMIN WITH MINERALS) TABS tablet Take 1 tablet by mouth daily.   Yes Historical Provider, MD  polyethylene glycol powder (GLYCOLAX/MIRALAX) powder TAKE 17 GRAMS MIXED IN LIQUID BY MOUTH DAILY. 06/07/15  Yes Shirline Frees, NP  ranitidine (ZANTAC) 300 MG capsule Take 300 mg by mouth daily.  01/01/15  Yes Historical Provider, MD  sertraline (ZOLOFT) 50 MG tablet TAKE 1 & 1/2 TABLETS BY MOUTH ONCE A DAY 05/12/15  Yes Historical Provider, MD  traZODone (DESYREL) 50 MG tablet TAKE 1 TABLET BY MOUTH AT BEDTIME **PATIENT NEEDS APPOINTMENT** 05/10/15  Yes Shirline Frees, NP  LORazepam (ATIVAN) 0.5 MG tablet TAKE 1 TABLET BY MOUTH TWICE A DAY Patient taking differently: TAKE 1 TABLET BY MOUTH TWICE A DAY AS NEEDED FOR ANXIETY 12/13/14   [name], FNP  losartan (COZAAR) 50 MG tablet Take 1 tablet (50 mg total) by mouth daily. Patient not taking: Reported on 06/19/2015 04/25/15   Shirline Frees, NP    Physical Exam: Filed Vitals:   06/20/15 0105 06/20/15 0300 06/20/15 0315 06/20/15 0508  BP: 159/100 169/88 169/88 150/112  Pulse: 70  65 71  Temp:      TempSrc:      Resp: SpO2:  94%  93% 96%     General:  Moderately built and nourished.  Eyes: Anicteric no pallor.  ENT: No discharge from the ears eyes nose and mouth.  Neck: No mass felt. No neck rigidity.  Cardiovascular: S1 and S2 heard.  Respiratory: No rhonchi or crepitations.  Abdomen: Soft nontender bowel sounds present.  Skin: No rash.  Musculoskeletal: No edema.  Psychiatric: Patient has severe dementia.  Neurologic: Patient has severe dementia. Follow certain commands. Difficult to have a complete neurological exam. Perla positive. No facial asymmetry.  Labs on Admission:  Basic  Metabolic Panel:  Recent Labs Lab 06/19/15 1826  NA 143  K 3.2*  CL 103  CO2 30  GLUCOSE 125*  BUN 29*  CREATININE 1.08*  CALCIUM 9.0   Liver Function Tests: No results for input(s): AST, ALT, ALKPHOS, BILITOT, PROT, ALBUMIN in the last 168 hours. No results for input(s): LIPASE, AMYLASE in the last 168 hours. No results for input(s): AMMONIA in the last 168 hours. CBC:  Recent Labs Lab 06/19/15 1826  WBC 4.6  HGB 11.5*  HCT 36.5  MCV 84.7  PLT 221   Cardiac Enzymes: No results for input(s): CKTOTAL, CKMB, CKMBINDEX, TROPONINI in the last 168 hours.  BNP (last 3 results) No results for input(s): BNP in the last 8760 hours.  ProBNP (last 3 results) No results for input(s): PROBNP in the last 8760 hours.  CBG:  Recent Labs Lab 06/19/15 2328  GLUCAP 99    Radiological Exams on Admission: Dg Chest 2 View  06/20/2015  CLINICAL DATA:  81 year old female with increasing weakness an multiple falls. EXAM: CHEST  2 VIEW COMPARISON:  Radiograph dated 04/13/2015 FINDINGS: Two views of the chest do not demonstrate a focal consolidation. There is no pleural effusion or pneumothorax. Stable cardiac silhouette. There is severe osteopenia with degenerative changes of the spine. IMPRESSION: No active cardiopulmonary disease. Electronically Signed   By: Elgie Collard M.D.   On: 06/20/2015 01:12   Ct Head Wo Contrast  06/20/2015  CLINICAL DATA:  81 year old female with right-sided weakness and frequent falls. EXAM: CT HEAD WITHOUT CONTRAST CT CERVICAL SPINE WITHOUT CONTRAST TECHNIQUE: Multidetector CT imaging of the head and cervical spine was performed following the standard protocol without intravenous contrast. Multiplanar CT image reconstructions of the cervical spine were also generated. COMPARISON:  Head CT dated 04/13/2015 and study dated 03/28/2014 FINDINGS: CT HEAD FINDINGS The ventricles are dilated and the sulci are prominent compatible with age-related atrophy.  Periventricular and deep white matter hypodensities represent chronic microvascular ischemic changes. There is no intracranial hemorrhage. No mass effect or midline shift identified. Small pockets of gas noted in the skullbase and in the region of the cavernous sinus, likely iatrogenic and related to IV access. The visualized paranasal sinuses and mastoid air cells are well aerated. The calvarium is intact. CT CERVICAL SPINE FINDINGS Evaluation of this exam is limited due to motion artifact. There is no definite acute fracture or subluxation of the cervical spine.There is osteopenia with multilevel degenerative changes. There is a 1 cm lytic appear lesion involving the left aspect of the vertebral body and left pedicle of the C5 similar to prior study dated 03/28/2014 likely related to expanded transverse foramen due to tortuous vessel. The odontoid and spinous processes are intact.There is normal anatomic alignment of the C1-C2 lateral masses. The visualized soft tissues appear unremarkable. IMPRESSION: No acute intracranial hemorrhage. Age-related atrophy and chronic microvascular ischemic disease. If symptoms persist and there are no contraindications, MRI  may provide better evaluation if clinically indicated. No acute fracture of the cervical spine. Electronically Signed   By: Elgie CollardArash  Radparvar M.D.   On: 06/20/2015 01:39   Ct Cervical Spine Wo Contrast  06/20/2015  CLINICAL DATA:  81 year old female with right-sided weakness and frequent falls. EXAM: CT HEAD WITHOUT CONTRAST CT CERVICAL SPINE WITHOUT CONTRAST TECHNIQUE: Multidetector CT imaging of the head and cervical spine was performed following the standard protocol without intravenous contrast. Multiplanar CT image reconstructions of the cervical spine were also generated. COMPARISON:  Head CT dated 04/13/2015 and study dated 03/28/2014 FINDINGS: CT HEAD FINDINGS The ventricles are dilated and the sulci are prominent compatible with age-related atrophy.  Periventricular and deep white matter hypodensities represent chronic microvascular ischemic changes. There is no intracranial hemorrhage. No mass effect or midline shift identified. Small pockets of gas noted in the skullbase and in the region of the cavernous sinus, likely iatrogenic and related to IV access. The visualized paranasal sinuses and mastoid air cells are well aerated. The calvarium is intact. CT CERVICAL SPINE FINDINGS Evaluation of this exam is limited due to motion artifact. There is no definite acute fracture or subluxation of the cervical spine.There is osteopenia with multilevel degenerative changes. There is a 1 cm lytic appear lesion involving the left aspect of the vertebral body and left pedicle of the C5 similar to prior study dated 03/28/2014 likely related to expanded transverse foramen due to tortuous vessel. The odontoid and spinous processes are intact.There is normal anatomic alignment of the C1-C2 lateral masses. The visualized soft tissues appear unremarkable. IMPRESSION: No acute intracranial hemorrhage. Age-related atrophy and chronic microvascular ischemic disease. If symptoms persist and there are no contraindications, MRI may provide better evaluation if clinically indicated. No acute fracture of the cervical spine. Electronically Signed   By: Elgie CollardArash  Radparvar M.D.   On: 06/20/2015 01:39    EKG: Independently reviewed. Normal sinus rhythm with nonspecific ST changes.  Assessment/Plan Principal Problem:   Acute encephalopathy Active Problems:   Severe dementia   Hyperlipidemia   1. Acute encephalopathy with frequent falls - among the differentials are stroke. Patient also was recently admitted for orthostatic hypotension when patient's antihypertensives were stopped. At this time MRI brain has been ordered. After discussing with the patient's family no aggressive measures are planned. If MRI shows stroke then patient will be on aspirin and physical therapy and will  not pursue aggressive measures or any further tests as discussed with patient's family. Check orthostatics. 2. Hypertension - presently off antihypertensives due to orthostatic changes recently seen in admission. Closely follow blood pressure trends. 3. History of hyperlipidemia on Zetia. 4. Chronic kidney disease stage II - creatinine appears to be at baseline. 5. Severe dementia. 6. Chronic anemia - follow CBC.   DVT Prophylaxis Lovenox.  Code Status: DO NOT RESUSCITATE.  Family Communication: Patient's daughter.  Disposition Plan: Admit to inpatient.    Maritsa Hunsucker N. [facility] Pager [phone].  If 7PM-7AM, please contact night-coverage [email or web address] Password [email or web address] 06/20/2015, 5:46 AM

## 2015-06-21 DIAGNOSIS — E876 Hypokalemia: Secondary | ICD-10-CM

## 2015-06-21 DIAGNOSIS — F0390 Unspecified dementia without behavioral disturbance: Secondary | ICD-10-CM

## 2015-06-21 DIAGNOSIS — I10 Essential (primary) hypertension: Secondary | ICD-10-CM

## 2015-06-21 DIAGNOSIS — E785 Hyperlipidemia, unspecified: Secondary | ICD-10-CM

## 2015-06-21 LAB — CBC
HCT: 35.8 % — ABNORMAL LOW (ref 36.0–46.0)
Hemoglobin: 11.1 g/dL — ABNORMAL LOW (ref 12.0–15.0)
MCH: 25.8 pg — AB (ref 26.0–34.0)
MCHC: 31 g/dL (ref 30.0–36.0)
MCV: 83.3 fL (ref 78.0–100.0)
PLATELETS: 193 10*3/uL (ref 150–400)
RBC: 4.3 MIL/uL (ref 3.87–5.11)
RDW: 14.4 % (ref 11.5–15.5)
WBC: 3.3 10*3/uL — AB (ref 4.0–10.5)

## 2015-06-21 LAB — BASIC METABOLIC PANEL
ANION GAP: 9 (ref 5–15)
BUN: 17 mg/dL (ref 6–20)
CHLORIDE: 104 mmol/L (ref 101–111)
CO2: 30 mmol/L (ref 22–32)
Calcium: 8.7 mg/dL — ABNORMAL LOW (ref 8.9–10.3)
Creatinine, Ser: 0.75 mg/dL (ref 0.44–1.00)
GFR calc Af Amer: >60 mL/min (ref >60)
GFR calc non Af Amer: >60 mL/min (ref >60)
GLUCOSE: 102 mg/dL — AB (ref 65–99)
POTASSIUM: 3.4 mmol/L — AB (ref 3.5–5.1)
Sodium: 143 mmol/L (ref 135–145)

## 2015-06-21 LAB — MAGNESIUM: Magnesium: 1.9 mg/dL (ref 1.7–2.4)

## 2015-06-21 LAB — URINE CULTURE

## 2015-06-21 MED ORDER — HYDROCORTISONE ACETATE 25 MG RE SUPP
25.0000 mg | Freq: Two times a day (BID) | RECTAL | Status: DC | PRN
  Filled 2015-06-21: qty 1

## 2015-06-21 MED ORDER — LOSARTAN POTASSIUM 50 MG PO TABS
50.0000 mg | ORAL_TABLET | Freq: Every day | ORAL | Status: DC
  Administered 2015-06-21 – 2015-06-23 (×3): 50 mg via ORAL
  Filled 2015-06-21 (×4): qty 1

## 2015-06-21 MED ORDER — ASPIRIN EC 81 MG PO TBEC
81.0000 mg | DELAYED_RELEASE_TABLET | Freq: Every day | ORAL | Status: DC
  Administered 2015-06-22 – 2015-06-23 (×2): 81 mg via ORAL
  Filled 2015-06-21 (×3): qty 1

## 2015-06-21 MED ORDER — POTASSIUM CHLORIDE CRYS ER 20 MEQ PO TBCR
40.0000 meq | EXTENDED_RELEASE_TABLET | Freq: Once | ORAL | Status: AC
  Administered 2015-06-21: 40 meq via ORAL
  Filled 2015-06-21: qty 2

## 2015-06-21 NOTE — Evaluation (Signed)
Physical Therapy Evaluation Patient Details Name: Diana Allison MRN: [id number] DOB: [birth year] Today's Date: 06/21/2015   History of Present Illness  81 year old female with history of advanced dementia, HTN, HLD, admitted on 06/19/15 for acute encephalopathy complicating advanced dementia, difficulty walking and frequent falls  Clinical Impression  Pt admitted with above diagnosis. Pt currently with functional limitations due to the deficits listed below (see PT Problem List).  Pt will benefit from skilled PT to increase their independence and safety with mobility to allow discharge to the venue listed below.   Daughter reports pt requires a little assist at baseline, mostly just guidance with mobility however more recently requiring increased assist and having increased falls, especially at night.  Daughter agreeable to ST-SNF upon d/c to assist pt closer to baseline so family can continue to assist at home.     Follow Up Recommendations SNF;Supervision/Assistance - 24 hour    Equipment Recommendations  None recommended by PT    Recommendations for Other Services       Precautions / Restrictions Precautions Precautions: Fall      Mobility  Bed Mobility Overal bed mobility: Needs Assistance;+2 for physical assistance Bed Mobility: Supine to Sit;Sit to Supine     Supine to sit: Mod assist;HOB elevated Sit to supine: Mod assist;HOB elevated   General bed mobility comments: multimodal cues for technique, assist for trunk upright and both upper and lower body upon return to bed  Transfers Overall transfer level: Needs assistance Equipment used: 2 person hand held assist Transfers: Sit to/from Stand Sit to Stand: Max assist;+2 physical assistance;From elevated surface         General transfer comment: multimodal cues for safety, pt crossing LEs which daughter reports pt will do at home and requires manual assist for correction, attempted twice however pt not assisting  well so returned to supine  Ambulation/Gait                Stairs            Wheelchair Mobility    Modified Rankin (Stroke Patients Only)       Balance Overall balance assessment: Needs assistance;History of Falls         Standing balance support: Bilateral upper extremity supported Standing balance-Leahy Scale: Zero                               Pertinent Vitals/Pain Pain Assessment: Faces Faces Pain Scale: No hurt    Home Living Family/patient expects to be discharged to:: Private residence Living Arrangements: Children (daughter) Available Help at Discharge: Family   Home Access: Stairs to enter   Entrance Stairs-Number of Steps: 8 Home Layout: One level Home Equipment: Environmental consultantWalker - 2 wheels;Cane - single point;Wheelchair - manual      Prior Function Level of Independence: Needs assistance   Gait / Transfers Assistance Needed: per daughter pt is usually ambulatory without assistive device however due to increased falls recently has required more assist, pt refuses to use RW per daughter     Comments: pt typically able to negotiate steps with guidance however has had to be lifted by family this past week     Hand Dominance        Extremity/Trunk Assessment               Lower Extremity Assessment: Generalized weakness         Communication   Communication: No difficulties  Cognition  Arousal/Alertness: Lethargic;Suspect due to medications (per daughter, sleepy due to meds) Behavior During Therapy: WFL for tasks assessed/performed Overall Cognitive Status: History of cognitive impairments - at baseline                      General Comments      Exercises        Assessment/Plan    PT Assessment Patient needs continued PT services  PT Diagnosis Difficulty walking   PT Problem List Decreased strength;Decreased activity tolerance;Decreased mobility;Decreased balance;Decreased safety awareness;Decreased  knowledge of use of DME  PT Treatment Interventions DME instruction;Gait training;Functional mobility training;Patient/family education;Therapeutic activities;Therapeutic exercise;Balance training   PT Goals (Current goals can be found in the Care Plan section) Acute Rehab PT Goals PT Goal Formulation: With patient/family Time For Goal Achievement: 06/28/15 Potential to Achieve Goals: Fair    Frequency Min 2X/week   Barriers to discharge        Co-evaluation               End of Session Equipment Utilized During Treatment: Gait belt Activity Tolerance: Patient limited by fatigue Patient left: in bed;with call bell/phone within reach;with bed alarm set;with family/visitor present           Time: 1045-1056 PT Time Calculation (min) (ACUTE ONLY): 11 min   Charges:   PT Evaluation $Initial PT Evaluation Tier I: 1 Procedure     PT G Codes:        Milee Qualls,KATHrine E 06/21/2015, 11:35 AM Zenovia Jarred, PT, DPT 06/21/2015 Pager: [phone]

## 2015-06-21 NOTE — Plan of Care (Signed)
Problem: Safety: Goal: Ability to remain free from injury will improve Outcome: Progressing Working with family to prevent falls, fall contract signed.

## 2015-06-21 NOTE — Clinical Social Work Placement (Signed)
   CLINICAL SOCIAL WORK PLACEMENT  NOTE  Date:  06/21/2015  Patient Details  Name: Diana Allison MRN: [id number] Date of Birth: Dec 09, 1932  Clinical Social Work is seeking post-discharge placement for this patient at the Skilled  Nursing Facility level of care (*CSW will initial, date and re-position this form in  chart as items are completed):  Yes   Patient/family provided with [facility] Clinical Social Work Department's list of facilities offering this level of care within the geographic area requested by the patient (or if unable, by the patient's family).  Yes   Patient/family informed of their freedom to choose among providers that offer the needed level of care, that participate in Medicare, Medicaid or managed care program needed by the patient, have an available bed and are willing to accept the patient.  Yes   Patient/family informed of [facility] ownership interest in [facility] and [facility], as well as of the fact that they are under no obligation to receive care at these facilities.  PASRR submitted to EDS on 06/21/15     PASRR number received on 06/21/15     Existing PASRR number confirmed on       FL2 transmitted to all facilities in geographic area requested by pt/family on       FL2 transmitted to all facilities within larger geographic area on 06/21/15     Patient informed that his/her managed care company has contracts with or will negotiate with certain facilities, including the following:        Yes   Patient/family informed of bed offers received.  Patient chooses bed at       Physician recommends and patient chooses bed at      Patient to be transferred to   on  .  Patient to be transferred to facility by       Patient family notified on   of transfer.  Name of family member notified:        PHYSICIAN       Additional Comment:    _______________________________________________ Arlyss RepressHarrison, Alexzandra Bilton F, LCSW 06/21/2015, 3:34  PM

## 2015-06-21 NOTE — NC FL2 (Signed)
[place] MEDICAID FL2 LEVEL OF CARE SCREENING TOOL     IDENTIFICATION  Patient Name: Diana Allison Birthdate: 1933/01/18 Sex: female Admission Date (Current Location): 06/19/2015  [facility] [place]   [facility] and Address:  [facility],  [address], [place] [id number]      Provider Number: [id number]  Attending Physician Name and Address:  Elease EtienneAnand D Hongalgi, MD  Relative Name and Phone Number:       Current Level of Care: Hospital Recommended Level of Care: Skilled Nursing Facility Prior Approval Number:    Date Approved/Denied:   PASRR Number:  [id number])  Discharge Plan: SNF    Current Diagnoses: Patient Active Problem List   Diagnosis Date Noted  . Acute encephalopathy 06/20/2015  . Hyperlipidemia 06/20/2015  . Orthostatic hypotension 04/16/2015  . Hypokalemia 04/16/2015  . Abnormal EEG   . Syncope and collapse 04/13/2015  . AKI (acute kidney injury) (HCC)   . Acute cystitis 03/31/2015  . Severe dementia 04/24/2014  . Dementia 03/22/2014  . GERD (gastroesophageal reflux disease) 03/22/2014  . Essential hypertension 03/22/2014  . Pure hypercholesterolemia 03/22/2014    Orientation RESPIRATION BLADDER Height & Weight    Self  Normal Incontinent 5' (152.4 cm) 128 lbs.  BEHAVIORAL SYMPTOMS/MOOD NEUROLOGICAL BOWEL NUTRITION STATUS      Continent Diet (regular)  AMBULATORY STATUS COMMUNICATION OF NEEDS Skin   Extensive Assist Verbally Normal                       Personal Care Assistance Level of Assistance  Bathing, Feeding, Dressing Bathing Assistance: Limited assistance Feeding assistance: Limited assistance Dressing Assistance: Limited assistance     Functional Limitations Info             SPECIAL CARE FACTORS FREQUENCY                       Contractures      Additional Factors Info  Code Status, Allergies Code Status Info: DNR Allergies Info: NKDA           Current  Medications (06/21/2015):  This is the current hospital active medication list Current Facility-Administered Medications  Medication Dose Route Frequency Provider Last Rate Last Dose  . [START ON 06/22/2015] aspirin EC tablet 81 mg  81 mg Oral Daily Elease EtienneAnand D Hongalgi, MD      . cefTRIAXone (ROCEPHIN) 1 g in dextrose 5 % 50 mL IVPB  1 g Intravenous Q24H Elease EtienneAnand D Hongalgi, MD   1 g at 06/21/15 1017  . cholecalciferol (VITAMIN D) tablet 1,000 Units  1,000 Units Oral Daily Eduard ClosArshad N Kakrakandy, MD   1,000 Units at 06/21/15 0950  . donepezil (ARICEPT) tablet 10 mg  10 mg Oral QHS Eduard ClosArshad N Kakrakandy, MD   10 mg at 06/20/15 2120  . enoxaparin (LOVENOX) injection 40 mg  40 mg Subcutaneous Q24H Eduard ClosArshad N Kakrakandy, MD   40 mg at 06/20/15 1515  . ezetimibe (ZETIA) tablet 10 mg  10 mg Oral QHS Eduard ClosArshad N Kakrakandy, MD   10 mg at 06/20/15 2120  . famotidine (PEPCID) tablet 40 mg  40 mg Oral Daily Eduard ClosArshad N Kakrakandy, MD   40 mg at 06/21/15 0941  . LORazepam (ATIVAN) tablet 0.5 mg  0.5 mg Oral BID PRN Eduard ClosArshad N Kakrakandy, MD      . losartan (COZAAR) tablet 50 mg  50 mg Oral Daily Elease EtienneAnand D Hongalgi, MD   50 mg at 06/21/15  1028  . senna-docusate (Senokot-S) tablet 1 tablet  1 tablet Oral QHS PRN Eduard Clos, MD      . sertraline (ZOLOFT) tablet 75 mg  75 mg Oral Daily Eduard Clos, MD   75 mg at 06/21/15 0945  . traZODone (DESYREL) tablet 50 mg  50 mg Oral QHS Eduard Clos, MD   50 mg at 06/20/15 2120     Discharge Medications: Please see discharge summary for a list of discharge medications.  Relevant Imaging Results:  Relevant Lab Results:   Additional Information  (SSN: [id number])  Arlyss Repress, LCSW

## 2015-06-21 NOTE — Progress Notes (Signed)
RN notified by MRI that MRI was unable to be completed due to pt being unable to lie still and follow commands. This RN made family aware. Will inform dayshift RN to inform attending MD. Will continue to monitor pt closely. Diana Allison, [name] I

## 2015-06-21 NOTE — Clinical Social Work Note (Signed)
Clinical Social Work Assessment  Patient Details  Name: Diana Allison MRN: [id number] Date of Birth: 09-13-1932  Date of referral:  06/21/15               Reason for consult:  Facility Placement                Permission sought to share information with:  [place]cean[place]ility Contact Representative Permission granted to share information::  Yes, Verbal Permission Granted  Name::        Agency::     Relationship::     Contact Information:     Housing/Transportation Living arrangements for the past 2 months:  Single Family Home Source of Information:  Patient, Adult Children Patient Interpreter Needed:  None Criminal Activity/Legal Involvement Pertinent to Current Situation/Hospitalization:  No - Comment as needed Significant Relationships:  Adult Children Lives with:  Adult Children Do you feel safe going back to the place where you live?  No Need for family participation in patient care:  Yes (Comment)  Care giving concerns:  CSW received consult for SNF placement - reviewed PT evaluation recommending SNF as well.    Social Worker assessment / plan:  CSW spoke with patient's daughter, Misty StanleyLisa at bedside to confirm plans for SNF. [facility] - provided bed offers.   Employment status:  Retired Health and safety inspectornsurance information:  Medicare PT Recommendations:  Skilled Nursing Facility Information / Referral to community resources:  Skilled Nursing Facility  Patient/Family's Response to care:  Daughter to discuss with her sisters this evening and will touch base with CSW in the morning with SNF decision.   Patient/Family's Understanding of and Emotional Response to Diagnosis, Current Treatment, and Prognosis:  Daughter understands need for SNF.   Emotional Assessment Appearance:  Appears stated age Attitude/Demeanor/Rapport:    Affect (typically observed):    Orientation:  Oriented to Self Alcohol / Substance use:    Psych involvement (Current and /or in the  community):     Discharge Needs  Concerns to be addressed:    Readmission within the last 30 days:    Current discharge risk:    Barriers to Discharge:      Arlyss RepressHarrison, Tressia Labrum F, LCSW 06/21/2015, 3:33 PM

## 2015-06-21 NOTE — Care Management Note (Signed)
Case Management Note  Patient Details  Name: Diana Allison MRN: [id number] Date of Birth: [birth year]  Subjective/Objective: 81 y/o f admitted w/Acute encephalopathy. From home. PT cons-SNF. CSW following for SNF.                   Action/Plan:d/c SNF.   Expected Discharge Date:   (UNKNOWN)               Expected Discharge Plan:  Skilled Nursing Facility  In-House Referral:  Clinical Social Work  Discharge planning Services  CM Consult  Post Acute Care Choice:    Choice offered to:     DME Arranged:    DME Agency:     HH Arranged:    HH Agency:     Status of Service:  In process, will continue to follow  Medicare Important Message Given:    Date Medicare IM Given:    Medicare IM give by:    Date Additional Medicare IM Given:    Additional Medicare Important Message give by:     If discussed at Long Length of Stay Meetings, dates discussed:    Additional Comments:  Diana Allison, Diana Anastos, RN 06/21/2015, 1:11 PM

## 2015-06-21 NOTE — Progress Notes (Signed)
PROGRESS NOTE    SHATORA WEATHERBEE WUJ:[id number] DOB: [birth year] DOA: 06/19/2015 PCP: Johny Blamer, MD  HPI/Brief narrative 81 year old female with history of advanced dementia, HTN, HLD, presented to the ED on 06/19/15 with worsening confusion over the last 3 days, difficulty walking with leaning to the right and frequent falls. Patient was advised to come to the ED by PCP. In the ED, CT head and neck unremarkable. Admitted for evaluation and management of acute on chronic encephalopathy.   Assessment/Plan:  Acute encephalopathy complicating advanced dementia - Etiology not entirely clear. No fevers reported. Urine microscopy does show a few bacteria, negative nitrite and 6-30 WBCs-? UTI. Chest x-ray: No acute cardiopulmonary disease. CT head and neck without acute findings. Other DD: CVA. Progressing dementia - Started IV Rocephin pending urine culture results. - Unable to do MRI due to patient's mental status and lack of cooperation. Extensive discussion with patient's daughter Mrs. Barbara at bedside. Advised her that even if MRI were to confirm a stroke, patient would not be a candidate for aggressive evaluation or intervention. Hence recommended no further workup but continuing aspirin for presumed stroke versus TIA. She is completely agreeable to this. Discussed with Neurology who also agree. - Therapies to evaluate. Speech therapy recommended regular diet and thin liquids.  Presumed UTI - Continue IV Rocephin pending urine culture results. Blood cultures 2: Pending  Essential hypertension - Uncontrolled and fluctuating. Patient was supposed to be on reduced dose of ARB at home. We'll resume same and monitor.   Hyperlipidemia - Continue Zetia  Stage II chronic kidney disease - Creatinine at baseline  Severe dementia - Mental status as discussed above  Anemia  - stable  Hypokalemia - replace and follow.  Bicytopenia-anemia and leukopenia -Unclear etiology but  stable. Outpatient follow-up.    DVT prophylaxis: Lovenox  Code Status: DO NOT RESUSCITATE  Family Communication: Discussed with patient's daughter Ms. Barbara at bedside on 12/29 Disposition Plan: To be determined . Possible discharge home on 12/13 pending therapy evaluation and urine culture results.   Consultants:  Neurology   Procedures:  None   Antibiotics:  IV Rocephin 12/28 >   Subjective: Pleasantly confused. Easily arousable. States "I'm okay". Oriented only to self. As per daughter at bedside, still confused, slurred speech.  Objective: Filed Vitals:   06/20/15 1222 06/20/15 2011 06/21/15 0608 06/21/15 0738  BP: 160/75 187/94 181/79 173/65  Pulse: 79 74 75   Temp: 98.1 F (36.7 C) 98 F (36.7 C) 98.2 F (36.8 C)   TempSrc: Oral Oral Oral   Resp: Height: 5' (1.524 m)     Weight: 58.06 kg (128 lb)     SpO2: 97% 96% 92%     Intake/Output Summary (Last 24 hours) at 06/21/15 1048 Last data filed at 06/21/15 9562  Gross per 24 hour  Intake    440 ml  Output      0 ml  Net    440 ml   Filed Weights   06/20/15 1222  Weight: 58.06 kg (128 lb)     Exam:  General exam: elderly female lying comfortably in bed Respiratory system: Clear. No increased work of breathing. Cardiovascular system: S1 & S2 heard, RRR. No JVD, murmurs, gallops, clicks or pedal edema.? Removed telemetry last night. Gastrointestinal system: Abdomen is nondistended, soft and nontender. Normal bowel sounds heard. Central nervous system: Alert and oriented only to self. No focal neurological deficits. Extremities: Symmetric 5 x 5 power.   Data  Reviewed: Basic Metabolic Panel:  Recent Labs Lab 06/19/15 1826 06/20/15 1312 06/21/15 0442  NA 143 141 143  K 3.2* 3.1* 3.4*  CL 103 102 104  CO2 GLUCOSE 125* 161* 102*  BUN 29* 19 17  CREATININE 1.08* 0.88 0.75  CALCIUM 9.0 8.7* 8.7*  MG  --   --  1.9   Liver Function Tests:  Recent Labs Lab  06/20/15 1312  AST 18  ALT 14  ALKPHOS 68  BILITOT 0.8  PROT 6.9  ALBUMIN 3.8   No results for input(s): LIPASE, AMYLASE in the last 168 hours. No results for input(s): AMMONIA in the last 168 hours. CBC:  Recent Labs Lab 06/19/15 1826 06/20/15 1312 06/21/15 0442  WBC 4.6 3.3* 3.3*  HGB 11.5* 11.2* 11.1*  HCT 36.5 35.9* 35.8*  MCV 84.7 84.3 83.3  PLT 221 197 193   Cardiac Enzymes: No results for input(s): CKTOTAL, CKMB, CKMBINDEX, TROPONINI in the last 168 hours. BNP (last 3 results) No results for input(s): PROBNP in the last 8760 hours. CBG:  Recent Labs Lab 06/19/15 2328  GLUCAP 99    Recent Results (from the past 240 hour(s))  Blood culture (routine x 2)     Status: None (Preliminary result)   Collection Time: 06/20/15  4:39 AM  Result Value Ref Range Status   Specimen Description BLOOD RIGHT FOREARM  Final   Special Requests BOTTLES DRAWN AEROBIC AND ANAEROBIC 5CC  Final   Culture PENDING  Incomplete   Report Status PENDING  Incomplete           Studies: Dg Chest 2 View  06/20/2015  CLINICAL DATA:  81 year old female with increasing weakness an multiple falls. EXAM: CHEST  2 VIEW COMPARISON:  Radiograph dated 04/13/2015 FINDINGS: Two views of the chest do not demonstrate a focal consolidation. There is no pleural effusion or pneumothorax. Stable cardiac silhouette. There is severe osteopenia with degenerative changes of the spine. IMPRESSION: No active cardiopulmonary disease. Electronically Signed   By: Elgie Collard M.D.   On: 06/20/2015 01:12   Ct Head Wo Contrast  06/20/2015  CLINICAL DATA:  81 year old female with right-sided weakness and frequent falls. EXAM: CT HEAD WITHOUT CONTRAST CT CERVICAL SPINE WITHOUT CONTRAST TECHNIQUE: Multidetector CT imaging of the head and cervical spine was performed following the standard protocol without intravenous contrast. Multiplanar CT image reconstructions of the cervical spine were also generated.  COMPARISON:  Head CT dated 04/13/2015 and study dated 03/28/2014 FINDINGS: CT HEAD FINDINGS The ventricles are dilated and the sulci are prominent compatible with age-related atrophy. Periventricular and deep white matter hypodensities represent chronic microvascular ischemic changes. There is no intracranial hemorrhage. No mass effect or midline shift identified. Small pockets of gas noted in the skullbase and in the region of the cavernous sinus, likely iatrogenic and related to IV access. The visualized paranasal sinuses and mastoid air cells are well aerated. The calvarium is intact. CT CERVICAL SPINE FINDINGS Evaluation of this exam is limited due to motion artifact. There is no definite acute fracture or subluxation of the cervical spine.There is osteopenia with multilevel degenerative changes. There is a 1 cm lytic appear lesion involving the left aspect of the vertebral body and left pedicle of the C5 similar to prior study dated 03/28/2014 likely related to expanded transverse foramen due to tortuous vessel. The odontoid and spinous processes are intact.There is normal anatomic alignment of the C1-C2 lateral masses. The visualized soft tissues appear unremarkable. IMPRESSION: No acute intracranial  hemorrhage. Age-related atrophy and chronic microvascular ischemic disease. If symptoms persist and there are no contraindications, MRI may provide better evaluation if clinically indicated. No acute fracture of the cervical spine. Electronically Signed   By: Elgie CollardArash  Radparvar M.D.   On: 06/20/2015 01:39   Ct Cervical Spine Wo Contrast  06/20/2015  CLINICAL DATA:  81 year old female with right-sided weakness and frequent falls. EXAM: CT HEAD WITHOUT CONTRAST CT CERVICAL SPINE WITHOUT CONTRAST TECHNIQUE: Multidetector CT imaging of the head and cervical spine was performed following the standard protocol without intravenous contrast. Multiplanar CT image reconstructions of the cervical spine were also generated.  COMPARISON:  Head CT dated 04/13/2015 and study dated 03/28/2014 FINDINGS: CT HEAD FINDINGS The ventricles are dilated and the sulci are prominent compatible with age-related atrophy. Periventricular and deep white matter hypodensities represent chronic microvascular ischemic changes. There is no intracranial hemorrhage. No mass effect or midline shift identified. Small pockets of gas noted in the skullbase and in the region of the cavernous sinus, likely iatrogenic and related to IV access. The visualized paranasal sinuses and mastoid air cells are well aerated. The calvarium is intact. CT CERVICAL SPINE FINDINGS Evaluation of this exam is limited due to motion artifact. There is no definite acute fracture or subluxation of the cervical spine.There is osteopenia with multilevel degenerative changes. There is a 1 cm lytic appear lesion involving the left aspect of the vertebral body and left pedicle of the C5 similar to prior study dated 03/28/2014 likely related to expanded transverse foramen due to tortuous vessel. The odontoid and spinous processes are intact.There is normal anatomic alignment of the C1-C2 lateral masses. The visualized soft tissues appear unremarkable. IMPRESSION: No acute intracranial hemorrhage. Age-related atrophy and chronic microvascular ischemic disease. If symptoms persist and there are no contraindications, MRI may provide better evaluation if clinically indicated. No acute fracture of the cervical spine. Electronically Signed   By: Elgie CollardArash  Radparvar M.D.   On: 06/20/2015 01:39        Scheduled Meds: Continuous Infusions:  Principal Problem:   Acute encephalopathy Active Problems:   Severe dementia   Hyperlipidemia    Time spent: 30 minutes.    Marcellus ScottHONGALGI,ANAND, MD, FACP, FHM. [facility] Pager [phone]  If 7PM-7AM, please contact night-coverage [email or web address] Password [email or web address] 06/21/2015, 10:48 AM    LOS: 1 day

## 2015-06-21 NOTE — Progress Notes (Signed)
OT Cancellation Note  Patient Details Name: Diana Allison MRN: [id number] DOB: 1932/09/20   Cancelled Treatment:    Reason Eval/Treat Not Completed: OT screened, no needs identified, will sign off. Plan is for patient to discharge > SNF. No acute OT needs identified, all needs can be met in SNF. Please send text page to OT services if any questions, concerns, or with new orders: [phone] OR call office at [phone]. Thank you for the order.    Chrys Racer , MS, OTR/L, CLT Pager: [phone]  06/21/2015, 1:25 PM

## 2015-06-22 ENCOUNTER — Inpatient Hospital Stay (HOSPITAL_COMMUNITY)
Admit: 2015-06-22 | Discharge: 2015-06-22 | Disposition: A | Discharge status: Home / Self Care | Payer: Medicare Other | Attending: Internal Medicine | Admitting: Internal Medicine

## 2015-06-22 LAB — BASIC METABOLIC PANEL
Anion gap: 10 (ref 5–15)
BUN: 17 mg/dL (ref 6–20)
CO2: 31 mmol/L (ref 22–32)
Calcium: 9.1 mg/dL (ref 8.9–10.3)
Chloride: 102 mmol/L (ref 101–111)
Creatinine, Ser: 0.85 mg/dL (ref 0.44–1.00)
GFR calc Af Amer: >60 mL/min (ref >60)
GLUCOSE: 108 mg/dL — AB (ref 65–99)
POTASSIUM: 4 mmol/L (ref 3.5–5.1)
Sodium: 143 mmol/L (ref 135–145)

## 2015-06-22 NOTE — Procedures (Signed)
EEG report.  Brief clinical history: 81 y/o with advanced dementia admitted due to new onsetrecurrent falls and report of nearly constant leaning to the right. Less responsive than usual on early neuro assessment.    Technique: this is a 17 channel routine scalp EEG performed at the bedside with bipolar and monopolar montages arranged in accordance to the international 10/20 system of electrode placement. One channel was dedicated to EKG recording.  Patient remains awake during the entire recording. No activating procedures performed.   Description: as the study opens and throughout the entire recording, there is evidence of diffuse, continuous, poorly reactive 5-6 Hz activity, at times with lower frequencies over the anterior head regions. No focal or generalized epileptiform discharges noted.  No electrographic seizures noted. EKG showed sinus rhythm.  Impression: this is an abnormal awake EEG that demonstrated findings consistent with a moderately severe encephalopathy, non specific, but in this particular clinical scenario very likely secondary to patient known advanced dementia. No electrographic seizures noted. Clinical correlation is advised.   Wyatt Portelasvaldo Camilo, MD [facility]

## 2015-06-22 NOTE — Progress Notes (Signed)
Offsite EEG completed at [facility]. Results pending. 

## 2015-06-22 NOTE — Progress Notes (Addendum)
NEURO HOSPITALIST PROGRESS NOTE   SUBJECTIVE:                                                                                                                        Patient is lying in bed, difficult to arouse, says YES but can not follow commands.  Daughter is at the bedside and stated that she was not like this at home. Couldn't tolerate MRI brain. Currently being treated for UTI. Otherwise, serologies are unrevealing and she remains afebrile. Appreciate PT ad Child psychotherapist input.  OBJECTIVE:                                                                                                                           Vital signs in last 24 hours: Temp:  [98.4 F (36.9 C)-99.1 F (37.3 C)] 98.4 F (36.9 C) (12/30 0442) Pulse Rate:  [71-85] 71 (12/30 0442) Resp:  [16-18] 16 (12/30 0442) BP: (136-159)/(89-107) 159/95 mmHg (12/30 0442) SpO2:  [96 %-100 %] 96 % (12/30 0442)  Intake/Output from previous day: 12/29 0701 - 12/30 0700 In: 360 [P.O.:360] Out: -  Intake/Output this shift:   Nutritional status: Diet regular Room service appropriate?: No; Fluid consistency:: Thin  Past Medical History  Diagnosis Date  . Hyperlipidemia   . Hypertension   . Cataract   . Dementia    Physical exam:  Constitutional: well developed, pleasant female in no apparent distress. Eyes: no jaundice or exophthalmos.  Head: normocephalic. Neck: tender on palpation, decreased range of motion, no bruits, no JVD. Cardiac: no murmurs. Lungs: clear. Abdomen: soft, no tender, no mass. Extremities: no edema, clubbing, or cyanosis.  Skin: no rash  Neurologic Exam:  Very limited neuro-exam due to impaired mental status. Mental Status: Doesn't open her eyes to painful stimuli, when asked if she is doing better replied "Yes" but is not able to follow commands. Cranial Nerves: II: Discs were not assessed; Visual fields couldn't be tested, pupils equal, round,  reactive to light III,IV, VI: ptosis not present, extra-ocular motions intact bilaterally V,VII: smile symmetric, facial light touch sensation unable to be reliably tested VIII: hearing normal bilaterally IX,X: uvula was not assessed as she does not open her mouth XI: bilateral shoulder shrug no tested XII:  unable to test Motor: Minimal motor movements upper extremities mai Sensory: reacts to noxious stimuli but does not localize Deep Tendon Reflexes:  1 all over Plantars: Right: downgoingLeft: downgoing Cerebellar: Unable to test due to patient inability to follow commands  Gait:  No tested due to multiple leads, mental status.  Lab Results: Lab Results  Component Value Date/Time   CHOL 242* 04/14/2015 03:31 AM   Lipid Panel No results for input(s): CHOL, TRIG, HDL, CHOLHDL, VLDL, LDLCALC in the last 72 hours.  Studies/Results: No results found.  MEDICATIONS                                                                                                                        Scheduled: . aspirin EC  81 mg Oral Daily  . cefTRIAXone (ROCEPHIN)  IV  1 g Intravenous Q24H  . cholecalciferol  1,000 Units Oral Daily  . donepezil  10 mg Oral QHS  . enoxaparin (LOVENOX) injection  40 mg Subcutaneous Q24H  . ezetimibe  10 mg Oral QHS  . famotidine  40 mg Oral Daily  . losartan  50 mg Oral Daily  . sertraline  75 mg Oral Daily  . traZODone  50 mg Oral QHS    ASSESSMENT/PLAN:                                                                                                           81 y/o with advanced dementia admitted due to new onsetrecurrent falls and report of nearly constant leaning to the right. Neuro-exam is very limited due to impaired mental status with inability to participate on exam. Couldn't tolerate MRI and will not favor re attempting MRI with sedation or CT/CTA brain as the results at this time will likely no change medical  management in this patient with advanced dementia. She appears very encephalopathic today (currently treated for UTI which could explain this), thus will order EEG to address the degree of encephalopathy or subclinical seizures. Still wonder if patient presentation is just a manifestation of progressive dementia. Plan for skill nursing facility. Will make further recommendations after EEG.  Wyatt Portelasvaldo Camilo, MD [facility] [phone]  06/22/2015, 10:08 AM Addendum: EEG showed no electrographic seizures but findings consistent with moderately severe encephalopathy, most likely due to underlying advanced dementia. No further inpatient neuro intervention required. Will sign off.   Wyatt Portelasvaldo Camilo, MD

## 2015-06-22 NOTE — Progress Notes (Addendum)
PROGRESS NOTE    Diana Allison ZOX:[id number] DOB: 1932/12/17 DOA: 06/19/2015 PCP: Johny BlamerHARRIS, WILLIAM, MD  HPI/Brief narrative 81 year old female with history of advanced dementia, HTN, HLD, presented to the ED on 06/19/15 with worsening confusion over the last 3 days, difficulty walking with leaning to the right and frequent falls. Patient was advised to come to the ED by PCP. In the ED, CT head and neck unremarkable. Admitted for evaluation and management of acute on chronic encephalopathy.   Assessment/Plan:  Acute encephalopathy complicating advanced dementia - Etiology not entirely clear. No fevers reported. Urine microscopy does show a few bacteria, negative nitrite and 6-30 WBCs-? UTI. Chest x-ray: No acute cardiopulmonary disease. CT head and neck without acute findings. Other DD: CVA. Progressing dementia - Started IV Rocephin pending urine culture results. Urines cultures suggestive of contamination. - Unable to do MRI due to patient's mental status and lack of cooperation. Extensive discussion with patient's daughter Mrs. Barbara at bedside on 12/29 and 12/30. Advised her that even if MRI were to confirm a stroke, patient would not be a candidate for aggressive evaluation or intervention. Hence recommended no further workup but continuing aspirin for presumed stroke versus TIA. She is completely agreeable to this. Discussed with Neurology who also agree. - Therapies recommends SNF which family is agreeable to. Speech therapy recommended regular diet and thin liquids. - Neurology follow-up appreciated: Suspect if all her presentation is a manifestation of progressive dementia. EEG checked and does not show seizure like activity but is abnormal demonstrating findings consistent with moderate severe encephalopathy-very likely secondary to known advanced dementia - Stroke was not confirmed but is suspected. As per discussion with family, decision made to treat her non-aggressively with  aspirin alone and no further aggressive stroke/TIA workup i.e. echo, carotid Dopplers. For the same reason no aggressive medical interventions (i.e. Statins) were started.  Presumed UTI - Started on IV Rocephin pending urine culture results. Urine culture results suggest contamination and hence not helpful. Complete 3 days of IV Rocephin and discontinue. Blood cultures 2: Pending  Essential hypertension - Uncontrolled and fluctuating. Patient was supposed to be on reduced dose of ARB at home. Resumed same and monitor. Better.  Hyperlipidemia - Continue Zetia  Stage II chronic kidney disease - Creatinine at baseline  Severe dementia - Mental status as discussed above  Anemia  - stable  Hypokalemia - replaced  Bicytopenia-anemia and leukopenia -Unclear etiology but stable. Outpatient follow-up.    DVT prophylaxis: Lovenox  Code Status: DO NOT RESUSCITATE  Family Communication: Discussed with patient's daughter Ms. Barbara at bedside on 12/30 [facility]  Consultants:  Neurology   Procedures:  EEG:  Antibiotics:  IV Rocephin 12/28 >   Subjective: Sleeping this morning. As per daughter Britta MccreedyBarbara at bedside, she remains confused, sleeping more than her baseline. As per RN report, seems to wake up during the later part of the morning and eats well. No acute events reported.  Objective: Filed Vitals:   06/21/15 1530 06/21/15 2226 06/22/15 0442 06/22/15 1319  BP: 136/89 157/97 159/95 124/89  Pulse:  85 71 84  Temp:  99.1 F (37.3 C) 98.4 F (36.9 C) 97.3 F (36.3 C)  TempSrc:  Oral Axillary Axillary  Resp:  18 16 16   Height:      Weight:      SpO2:  100% 96% 95%    Intake/Output Summary (Last 24 hours) at 06/22/15 1609 Last data filed at 06/22/15 1320  Gross per 24 hour  Intake    360 ml  Output      0 ml  Net    360 ml   Filed Weights   06/20/15 1222  Weight: 58.06 kg (128 lb)     Exam:  General  exam: elderly female lying comfortably in bed Respiratory system: Clear. No increased work of breathing. Cardiovascular system: S1 & S2 heard, RRR. No JVD, murmurs, gallops, clicks or pedal edema.? Removed telemetry last night. Gastrointestinal system: Abdomen is nondistended, soft and nontender. Normal bowel sounds heard. Central nervous system: Sleeping when seen this morning. No focal neurological deficits. Extremities: Symmetric 5 x 5 power.   Data Reviewed: Basic Metabolic Panel:  Recent Labs Lab 06/19/15 1826 06/20/15 1312 06/21/15 0442 06/22/15 0500  NA 143 141 143 143  K 3.2* 3.1* 3.4* 4.0  CL 103 102 104 102  CO2 GLUCOSE 125* 161* 102* 108*  BUN 29* CREATININE 1.08* 0.88 0.75 0.85  CALCIUM 9.0 8.7* 8.7* 9.1  MG  --   --  1.9  --    Liver Function Tests:  Recent Labs Lab 06/20/15 1312  AST 18  ALT 14  ALKPHOS 68  BILITOT 0.8  PROT 6.9  ALBUMIN 3.8   No results for input(s): LIPASE, AMYLASE in the last 168 hours. No results for input(s): AMMONIA in the last 168 hours. CBC:  Recent Labs Lab 06/19/15 1826 06/20/15 1312 06/21/15 0442  WBC 4.6 3.3* 3.3*  HGB 11.5* 11.2* 11.1*  HCT 36.5 35.9* 35.8*  MCV 84.7 84.3 83.3  PLT 221 197 193   Cardiac Enzymes: No results for input(s): CKTOTAL, CKMB, CKMBINDEX, TROPONINI in the last 168 hours. BNP (last 3 results) No results for input(s): PROBNP in the last 8760 hours. CBG:  Recent Labs Lab 06/19/15 2328  GLUCAP 99    Recent Results (from the past 240 hour(s))  Urine culture     Status: None   Collection Time: 06/20/15  1:44 AM  Result Value Ref Range Status   Specimen Description URINE, CLEAN CATCH  Final   Special Requests NONE  Final   Culture   Final    MULTIPLE SPECIES PRESENT, SUGGEST RECOLLECTION Performed at [facility]    Report Status 06/21/2015 FINAL  Final  Blood culture (routine x 2)     Status: None (Preliminary result)   Collection Time: 06/20/15   4:39 AM  Result Value Ref Range Status   Specimen Description BLOOD RIGHT FOREARM  Final   Special Requests BOTTLES DRAWN AEROBIC AND ANAEROBIC 5CC  Final   Culture   Final    NO GROWTH 2 DAYS Performed at [facility]    Report Status PENDING  Incomplete  Blood culture (routine x 2)     Status: None (Preliminary result)   Collection Time: 06/20/15  4:54 AM  Result Value Ref Range Status   Specimen Description BLOOD RIGHT ANTECUBITAL  Final   Special Requests BOTTLES DRAWN AEROBIC AND ANAEROBIC 5CC  Final   Culture   Final    NO GROWTH 2 DAYS Performed at [facility]    Report Status PENDING  Incomplete           Studies: No results found.      Scheduled Meds: Continuous Infusions:  Principal Problem:   Acute encephalopathy Active Problems:   Severe dementia   Hyperlipidemia    Time spent: 30 minutes.    Marcellus Scott, MD,  FACP, FHM. [facility] Pager [phone]  If 7PM-7AM, please contact night-coverage [email or web address] Password [email or web address] 06/22/2015, 4:09 PM    LOS: 2 days

## 2015-06-23 DIAGNOSIS — N3000 Acute cystitis without hematuria: Secondary | ICD-10-CM

## 2015-06-23 MED ORDER — CEFUROXIME AXETIL 250 MG PO TABS: 250.0000 mg | ORAL_TABLET | Freq: Two times a day (BID) | ORAL | Status: DC

## 2015-06-23 MED ORDER — TRAZODONE HCL 50 MG PO TABS: 25.0000 mg | ORAL_TABLET | Freq: Every day | ORAL | Status: DC

## 2015-06-23 MED ORDER — HYDROCORTISONE ACETATE 25 MG RE SUPP: 25.0000 mg | Freq: Two times a day (BID) | RECTAL | Status: AC | PRN

## 2015-06-23 NOTE — Discharge Summary (Signed)
Physician Discharge Summary  Diana Allison WGN:[id number] DOB: 11-Jul-1932 DOA: 06/19/2015  PCP: Johny BlamerHARRIS, WILLIAM, MD  Admit date: 06/19/2015 Discharge date: 06/23/2015  Time spent: Greater than 30 minutes  Recommendations for Outpatient Follow-up:  1. M.D. at SNF in 3-5 days with repeat labs (CBC & BMP). Please follow final blood culture results that were sent from the hospital. 2. Dr. Johny BlamerWilliam Harris, PCP upon discharge from SNF.  Discharge Diagnoses:  Principal Problem:   Acute encephalopathy Active Problems:   Severe dementia   Hyperlipidemia   Discharge Condition: Improved & Stable  Diet recommendation: Heart healthy diet.  Filed Weights   06/20/15 1222  Weight: 58.06 kg (128 lb)    History of present illness:  81 year old female with history of advanced dementia, HTN, HLD, presented to the ED on 06/19/15 with worsening confusion over the last 3 days, difficulty walking with leaning to the right and frequent falls. Patient was advised to come to the ED by PCP. In the ED, CT head and neck unremarkable. Admitted for evaluation and management of acute on chronic encephalopathy.  Hospital Course:   Acute encephalopathy complicating advanced dementia - Etiology not entirely clear.  - DD: Progressive dementia, UTI, CVA, medications (trazodone) - Urine microscopy was suspicious for UTI. Patient was empirically placed on IV Rocephin. Urine culture unfortunately is suggestive of contamination and hence is not helpful. Transitioned to oral Ceftin at discharge to complete a total 7 days course. Despite treatment for UTI, mental status did not make significant improvement. - Chest x-ray: No acute cardiopulmonary disease. CT head and neck without acute findings.  - Unable to do MRI due to patient's mental status and lack of cooperation. Extensive discussion with patient's daughter Diana Allison at bedside on 12/29 and 12/30. Advised her that even if MRI were to confirm a ischemic  stroke, patient would not be a candidate for aggressive evaluation or intervention. Hence recommended no further workup but continuing aspirin for presumed stroke versus TIA. She is completely agreeable to this. Discussed with Neurology who also agree. - Therapies recommends SNF which family is agreeable to. Speech therapy recommended regular diet and thin liquids. - Neurology follow-up appreciated: Suspect if all her presentation is a manifestation of progressive dementia. EEG checked and does not show seizure like activity but is abnormal demonstrating findings consistent with moderate severe encephalopathy-very likely secondary to known advanced dementia - Stroke was not confirmed but is suspected. As per discussion with family, decision made to treat her non-aggressively with aspirin alone and no further aggressive stroke/TIA workup i.e. echo, carotid Dopplers etc. For the same reason no aggressive medical interventions (i.e. Statins) were started. - Patient seems to be sleepy during the early morning hours and gradually wakes up and is alert during the latter part of the day. She is on Zoloft and trazodone which may be contributing to her mental status change. Reduced trazodone dose by half at discharge. Her mental status can be further monitored at SNF and may consider discontinuing trazodone and or reducing dose of Zoloft. Patient has not required any when necessary Ativan in the hospital and hence will be discharged without it.  Presumed UTI - Started on IV Rocephin pending urine culture results. Urine culture results suggest contamination and hence not helpful. Discharge on oral Ceftin to complete total 7 days treatment. Blood cultures 2: Negative to date.  Essential hypertension - Uncontrolled and fluctuating. Patient was supposed to be on reduced dose of ARB at home. Resumed same and monitor. Better. May need  titration of medications at SNF.  Hyperlipidemia - Continue Zetia.   Stage II  chronic kidney disease - Creatinine at baseline  Severe dementia - Mental status as discussed above  Anemia  - stable  Hypokalemia - replaced  Bicytopenia-anemia and leukopenia -Unclear etiology but stable. Outpatient follow-up.    Consultants:  Neurology  Procedures:  EEG:  Antibiotics:  IV Rocephin 12/28 > 12/31  Discharge Exam:  Complaints: Patient awake and alert. Oriented only to self. Follows some simple instructions. Ate well for breakfast.  Filed Vitals:   06/22/15 0442 06/22/15 1319 06/22/15 2113 06/23/15 0414  BP: 159/95 124/89 179/95 173/88  Pulse: 71 84 81 71  Temp: 98.4 F (36.9 C) 97.3 F (36.3 C) 97.3 F (36.3 C) 98.2 F (36.8 C)  TempSrc: Axillary Axillary Axillary Oral  Resp: Height:      Weight:      SpO2: 96% 95% 98% 99%    General exam: elderly female lying comfortably in bed Respiratory system: Clear. No increased work of breathing. Cardiovascular system: S1 & S2 heard, RRR. No JVD, murmurs, gallops, clicks or pedal edema. Gastrointestinal system: Abdomen is nondistended, soft and nontender. Normal bowel sounds heard. Central nervous system: Alert and oriented only to self. No focal neurological deficits. Extremities: Symmetric 5 x 5 power.  Discharge Instructions      Discharge Instructions    Call MD for:  difficulty breathing, headache or visual disturbances    Complete by:  As directed      Call MD for:  extreme fatigue    Complete by:  As directed      Call MD for:  persistant dizziness or light-headedness    Complete by:  As directed      Call MD for:  persistant nausea and vomiting    Complete by:  As directed      Call MD for:  severe uncontrolled pain    Complete by:  As directed      Call MD for:  temperature >100.4    Complete by:  As directed      Call MD for:    Complete by:  As directed   Worsening confusion or mental status change or strokelike symptoms.     Diet - low sodium heart healthy     Complete by:  As directed      Increase activity slowly    Complete by:  As directed             Medication List    STOP taking these medications        LORazepam 0.5 MG tablet  Commonly known as:  ATIVAN      TAKE these medications        aspirin EC 81 MG tablet  Take 81 mg by mouth daily.     cefUROXime 250 MG tablet  Commonly known as:  CEFTIN  Take 1 tablet (250 mg total) by mouth 2 (two) times daily with a meal. Take on 06/24/15 and 06/25/15, then stop.     cholecalciferol 1000 units tablet  Commonly known as:  VITAMIN D  Take 1,000 Units by mouth daily.     COCONUT OIL PO  Take 2 capsules by mouth 2 (two) times daily.     donepezil 10 MG tablet  Commonly known as:  ARICEPT  Take 10 mg by mouth at bedtime.     ezetimibe 10 MG tablet  Commonly known as:  ZETIA  Take 10  mg by mouth at bedtime.     hydrocortisone 25 MG suppository  Commonly known as:  ANUSOL-HC  Place 1 suppository (25 mg total) rectally 2 (two) times daily as needed for hemorrhoids or itching.     losartan 50 MG tablet  Commonly known as:  COZAAR  Take 1 tablet (50 mg total) by mouth daily.     multivitamin with minerals Tabs tablet  Take 1 tablet by mouth daily.     polyethylene glycol powder powder  Commonly known as:  GLYCOLAX/MIRALAX  TAKE 17 GRAMS MIXED IN LIQUID BY MOUTH DAILY.     ranitidine 300 MG capsule  Commonly known as:  ZANTAC  Take 300 mg by mouth daily.     sertraline 50 MG tablet  Commonly known as:  ZOLOFT  TAKE 1 & 1/2 TABLETS BY MOUTH ONCE A DAY     traZODone 50 MG tablet  Commonly known as:  DESYREL  Take 0.5 tablets (25 mg total) by mouth at bedtime.          The results of significant diagnostics from this hospitalization (including imaging, microbiology, ancillary and laboratory) are listed below for reference.    Significant Diagnostic Studies: Dg Chest 2 View  06/20/2015  CLINICAL DATA:  81 year old female with increasing weakness an multiple falls.  EXAM: CHEST  2 VIEW COMPARISON:  Radiograph dated 04/13/2015 FINDINGS: Two views of the chest do not demonstrate a focal consolidation. There is no pleural effusion or pneumothorax. Stable cardiac silhouette. There is severe osteopenia with degenerative changes of the spine. IMPRESSION: No active cardiopulmonary disease. Electronically Signed   By: Elgie Collard M.D.   On: 06/20/2015 01:12   Ct Head Wo Contrast  06/20/2015  CLINICAL DATA:  81 year old female with right-sided weakness and frequent falls. EXAM: CT HEAD WITHOUT CONTRAST CT CERVICAL SPINE WITHOUT CONTRAST TECHNIQUE: Multidetector CT imaging of the head and cervical spine was performed following the standard protocol without intravenous contrast. Multiplanar CT image reconstructions of the cervical spine were also generated. COMPARISON:  Head CT dated 04/13/2015 and study dated 03/28/2014 FINDINGS: CT HEAD FINDINGS The ventricles are dilated and the sulci are prominent compatible with age-related atrophy. Periventricular and deep white matter hypodensities represent chronic microvascular ischemic changes. There is no intracranial hemorrhage. No mass effect or midline shift identified. Small pockets of gas noted in the skullbase and in the region of the cavernous sinus, likely iatrogenic and related to IV access. The visualized paranasal sinuses and mastoid air cells are well aerated. The calvarium is intact. CT CERVICAL SPINE FINDINGS Evaluation of this exam is limited due to motion artifact. There is no definite acute fracture or subluxation of the cervical spine.There is osteopenia with multilevel degenerative changes. There is a 1 cm lytic appear lesion involving the left aspect of the vertebral body and left pedicle of the C5 similar to prior study dated 03/28/2014 likely related to expanded transverse foramen due to tortuous vessel. The odontoid and spinous processes are intact.There is normal anatomic alignment of the C1-C2 lateral masses.  The visualized soft tissues appear unremarkable. IMPRESSION: No acute intracranial hemorrhage. Age-related atrophy and chronic microvascular ischemic disease. If symptoms persist and there are no contraindications, MRI may provide better evaluation if clinically indicated. No acute fracture of the cervical spine. Electronically Signed   By: Elgie Collard M.D.   On: 06/20/2015 01:39   Ct Cervical Spine Wo Contrast  06/20/2015  CLINICAL DATA:  81 year old female with right-sided weakness and frequent falls. EXAM: CT HEAD  WITHOUT CONTRAST CT CERVICAL SPINE WITHOUT CONTRAST TECHNIQUE: Multidetector CT imaging of the head and cervical spine was performed following the standard protocol without intravenous contrast. Multiplanar CT image reconstructions of the cervical spine were also generated. COMPARISON:  Head CT dated 04/13/2015 and study dated 03/28/2014 FINDINGS: CT HEAD FINDINGS The ventricles are dilated and the sulci are prominent compatible with age-related atrophy. Periventricular and deep white matter hypodensities represent chronic microvascular ischemic changes. There is no intracranial hemorrhage. No mass effect or midline shift identified. Small pockets of gas noted in the skullbase and in the region of the cavernous sinus, likely iatrogenic and related to IV access. The visualized paranasal sinuses and mastoid air cells are well aerated. The calvarium is intact. CT CERVICAL SPINE FINDINGS Evaluation of this exam is limited due to motion artifact. There is no definite acute fracture or subluxation of the cervical spine.There is osteopenia with multilevel degenerative changes. There is a 1 cm lytic appear lesion involving the left aspect of the vertebral body and left pedicle of the C5 similar to prior study dated 03/28/2014 likely related to expanded transverse foramen due to tortuous vessel. The odontoid and spinous processes are intact.There is normal anatomic alignment of the C1-C2 lateral masses.  The visualized soft tissues appear unremarkable. IMPRESSION: No acute intracranial hemorrhage. Age-related atrophy and chronic microvascular ischemic disease. If symptoms persist and there are no contraindications, MRI may provide better evaluation if clinically indicated. No acute fracture of the cervical spine. Electronically Signed   By: Elgie Collard M.D.   On: 06/20/2015 01:39    Microbiology: Recent Results (from the past 240 hour(s))  Urine culture     Status: None   Collection Time: 06/20/15  1:44 AM  Result Value Ref Range Status   Specimen Description URINE, CLEAN CATCH  Final   Special Requests NONE  Final   Culture   Final    MULTIPLE SPECIES PRESENT, SUGGEST RECOLLECTION Performed at [facility]    Report Status 06/21/2015 FINAL  Final  Blood culture (routine x 2)     Status: None (Preliminary result)   Collection Time: 06/20/15  4:39 AM  Result Value Ref Range Status   Specimen Description BLOOD RIGHT FOREARM  Final   Special Requests BOTTLES DRAWN AEROBIC AND ANAEROBIC 5CC  Final   Culture   Final    NO GROWTH 3 DAYS Performed at [facility]    Report Status PENDING  Incomplete  Blood culture (routine x 2)     Status: None (Preliminary result)   Collection Time: 06/20/15  4:54 AM  Result Value Ref Range Status   Specimen Description BLOOD RIGHT ANTECUBITAL  Final   Special Requests BOTTLES DRAWN AEROBIC AND ANAEROBIC 5CC  Final   Culture   Final    NO GROWTH 3 DAYS Performed at [facility]    Report Status PENDING  Incomplete     Labs: Basic Metabolic Panel:  Recent Labs Lab 06/19/15 1826 06/20/15 1312 06/21/15 0442 06/22/15 0500  NA 143 141 143 143  K 3.2* 3.1* 3.4* 4.0  CL 103 102 104 102  CO2 30 29 30 31   GLUCOSE 125* 161* 102* 108*  BUN 29* 19 17 17   CREATININE 1.08* 0.88 0.75 0.85  CALCIUM 9.0 8.7* 8.7* 9.1  MG  --   --  1.9  --    Liver Function Tests:  Recent Labs Lab 06/20/15 1312  AST 18  ALT 14  ALKPHOS  68  BILITOT 0.8  PROT 6.9  ALBUMIN 3.8   No results for input(s): LIPASE, AMYLASE in the last 168 hours. No results for input(s): AMMONIA in the last 168 hours. CBC:  Recent Labs Lab 06/19/15 1826 06/20/15 1312 06/21/15 0442  WBC 4.6 3.3* 3.3*  HGB 11.5* 11.2* 11.1*  HCT 36.5 35.9* 35.8*  MCV 84.7 84.3 83.3  PLT 221 197 193   Cardiac Enzymes: No results for input(s): CKTOTAL, CKMB, CKMBINDEX, TROPONINI in the last 168 hours. BNP: BNP (last 3 results) No results for input(s): BNP in the last 8760 hours.  ProBNP (last 3 results) No results for input(s): PROBNP in the last 8760 hours.  CBG:  Recent Labs Lab 06/19/15 2328  GLUCAP 99        Signed:  Marcellus Scott, MD, FACP, FHM. [facility] Pager [phone]  If 7PM-7AM, please contact night-coverage [email or web address] Password [email or web address] 06/23/2015, 11:50 AM

## 2015-06-23 NOTE — Progress Notes (Signed)
[facility].   CSW facilitated pt discharge needs including contacting facility, faxing pt discharge information via [employer] [facility], discussing with pt daughter,  SinkRita via telephone, providing RN phone number to call report, and arranging ambulance transport [facility].  No further social work needs identified at this time.  CSW signing off.   Loletta SpecterSuzanna Kimani Hovis, MSW, LCSW Clinical Social Work [phone]

## 2015-06-23 NOTE — Care Management Important Message (Signed)
Important Message  Patient Details  Name: Diana Allison MRN: [id number] Date of Birth: 11/19/1932   Medicare Important Message Given:  Yes    Elliot CousinShavis, Declin Rajan Ellen, RN 06/23/2015, 12:57 PM

## 2015-06-23 NOTE — Clinical Social Work Placement (Signed)
   CLINICAL SOCIAL WORK PLACEMENT  NOTE  Date:  06/23/2015  Patient Details  Name: Diana Allison MRN: [id number] Date of Birth: [birth year]  Clinical Social Work is seeking post-discharge placement for this patient at the Skilled  Nursing Facility level of care (*CSW will initial, date and re-position this form in  chart as items are completed):  Yes   Patient/family provided with [facility] Clinical Social Work Department's list of facilities offering this level of care within the geographic area requested by the patient (or if unable, by the patient's family).  Yes   Patient/family informed of their freedom to choose among providers that offer the needed level of care, that participate in Medicare, Medicaid or managed care program needed by the patient, have an available bed and are willing to accept the patient.  Yes   Patient/family informed of [facility]'s ownership interest in [facility] and [facility], as well as of the fact that they are under no obligation to receive care at these facilities.  PASRR submitted to EDS on 06/21/15     PASRR number received on 06/21/15     Existing PASRR number confirmed on       FL2 transmitted to all facilities in geographic area requested by pt/family on       FL2 transmitted to all facilities within larger geographic area on 06/21/15     Patient informed that his/her managed care company has contracts with or will negotiate with certain facilities, including the following:        Yes   Patient/family informed of bed offers received.  [facility]     Physician recommends and patient chooses bed at      [facility] 06[facility].  Patient to be transferred to facility by ambulance [facility])     Patient family notified on 06/23/15 of transfer.  Name of family member notified:  pt daughter, Elverta SinkRita notified via telephone     PHYSICIAN        Additional Comment:    _______________________________________________ Orson EvaKIDD, SUZANNA A, LCSW 06/23/2015, 12:55 PM

## 2015-06-25 LAB — CULTURE, BLOOD (ROUTINE X 2)
CULTURE: NO GROWTH
CULTURE: NO GROWTH

## 2015-10-01 ENCOUNTER — Ambulatory Visit: Payer: Medicare Other | Admitting: Family Medicine

## 2015-10-18 ENCOUNTER — Encounter (HOSPITAL_COMMUNITY): Payer: Self-pay | Admitting: Emergency Medicine

## 2015-10-18 ENCOUNTER — Emergency Department (HOSPITAL_COMMUNITY)

## 2015-10-18 ENCOUNTER — Emergency Department (HOSPITAL_COMMUNITY)
Admission: EM | Admit: 2015-10-18 | Discharge: 2015-10-18 | Disposition: A | Discharge status: Home / Self Care | Attending: Emergency Medicine | Admitting: Emergency Medicine

## 2015-10-18 DIAGNOSIS — Y92210 Daycare center as the place of occurrence of the external cause: Secondary | ICD-10-CM | POA: Insufficient documentation

## 2015-10-18 DIAGNOSIS — E785 Hyperlipidemia, unspecified: Secondary | ICD-10-CM | POA: Insufficient documentation

## 2015-10-18 DIAGNOSIS — W07XXXA Fall from chair, initial encounter: Secondary | ICD-10-CM

## 2015-10-18 DIAGNOSIS — S299XXA Unspecified injury of thorax, initial encounter: Secondary | ICD-10-CM | POA: Diagnosis present

## 2015-10-18 DIAGNOSIS — Z79899 Other long term (current) drug therapy: Secondary | ICD-10-CM | POA: Diagnosis not present

## 2015-10-18 DIAGNOSIS — S0083XA Contusion of other part of head, initial encounter: Secondary | ICD-10-CM | POA: Diagnosis not present

## 2015-10-18 DIAGNOSIS — Y998 Other external cause status: Secondary | ICD-10-CM | POA: Insufficient documentation

## 2015-10-18 DIAGNOSIS — F0390 Unspecified dementia without behavioral disturbance: Secondary | ICD-10-CM | POA: Diagnosis not present

## 2015-10-18 DIAGNOSIS — Z8669 Personal history of other diseases of the nervous system and sense organs: Secondary | ICD-10-CM | POA: Diagnosis not present

## 2015-10-18 DIAGNOSIS — S32020A Wedge compression fracture of second lumbar vertebra, initial encounter for closed fracture: Secondary | ICD-10-CM | POA: Insufficient documentation

## 2015-10-18 DIAGNOSIS — S32010A Wedge compression fracture of first lumbar vertebra, initial encounter for closed fracture: Secondary | ICD-10-CM | POA: Insufficient documentation

## 2015-10-18 DIAGNOSIS — S22080A Wedge compression fracture of T11-T12 vertebra, initial encounter for closed fracture: Secondary | ICD-10-CM | POA: Insufficient documentation

## 2015-10-18 DIAGNOSIS — S0093XA Contusion of unspecified part of head, initial encounter: Secondary | ICD-10-CM

## 2015-10-18 DIAGNOSIS — I10 Essential (primary) hypertension: Secondary | ICD-10-CM | POA: Insufficient documentation

## 2015-10-18 DIAGNOSIS — Y9389 Activity, other specified: Secondary | ICD-10-CM | POA: Diagnosis not present

## 2015-10-18 DIAGNOSIS — Z7982 Long term (current) use of aspirin: Secondary | ICD-10-CM | POA: Diagnosis not present

## 2015-10-18 MED ORDER — DOCUSATE SODIUM 100 MG PO CAPS: 100.0000 mg | ORAL_CAPSULE | Freq: Two times a day (BID) | ORAL | Status: DC

## 2015-10-18 MED ORDER — HYDROCODONE-ACETAMINOPHEN 5-325 MG PO TABS: 2.0000 | ORAL_TABLET | ORAL | Status: DC | PRN

## 2015-10-18 NOTE — ED Notes (Signed)
Bed: WA04 Expected date:  Expected time:  Means of arrival:  Comments: EMS-hematoma/fall

## 2015-10-18 NOTE — ED Notes (Signed)
MD at bedside. 

## 2015-10-18 NOTE — ED Notes (Signed)
Per EMS from adult daycare for fall from wheelchair, hematoma on right forehead, no LOC, cause of fall unknown. Patient has right sided weakness at baseline, per family. Per EMS staff states patient is at mental baseline.

## 2015-10-18 NOTE — ED Provider Notes (Signed)
CSN: [id number]     Arrival date & time 10/18/15  1634 History   First MD Initiated Contact with Patient 10/18/15 1640     Chief Complaint  Patient presents with  . Fall  PT HAS A HX OF DEMENTIA AND WAS AT AN ADULT DAY CARE TODAY WHEN SHE FELL OUT OF THE WHEELCHAIR.  SHE HIT HER HEAD, BUT DID NOT HAVE A LOC.  PT IS UNABLE TO GIVE ANY HX DUE TO SEVERE DEMENTIA. PT'S FAMILY MEMBER FEELS LIKE PT'S BACK IS HURTING.   (Consider location/radiation/quality/duration/timing/severity/associated sxs/prior Treatment) Patient is a 82 y.o. female presenting with fall. The history is provided by the EMS personnel and a relative.  Fall This is a new problem. The current episode started less than 1 hour ago. The problem has not changed since onset.   Past Medical History  Diagnosis Date  . Hyperlipidemia   . Hypertension   . Cataract   . Dementia    Past Surgical History  Procedure Laterality Date  . Knee surgery    . Vaginal hysterectomy     Family History  Problem Relation Age of Onset  . Dementia Neg Hx   . Hypertension Daughter    Social History  Substance Use Topics  . Smoking status: Never Smoker   . Smokeless tobacco: Never Used  . Alcohol Use: No   OB History    No data available     Review of Systems  Unable to perform ROS: Dementia  Musculoskeletal: Positive for back pain.      Allergies  Review of patient's allergies indicates no known allergies.  Home Medications   Prior to Admission medications   Medication Sig Start Date End Date Taking? Authorizing Provider  donepezil (ARICEPT) 10 MG tablet Take 10 mg by mouth at bedtime.  06/30/14  Yes Historical Provider, MD  hydrochlorothiazide (HYDRODIURIL) 25 MG tablet Take 25 mg by mouth daily.   Yes Historical Provider, MD  LORazepam (ATIVAN) 0.5 MG tablet Take 0.5 mg by mouth 2 (two) times daily as needed for anxiety.   Yes Historical Provider, MD  losartan (COZAAR) 100 MG tablet Take 100 mg by mouth daily.   Yes  Historical Provider, MD  losartan (COZAAR) 50 MG tablet Take 1 tablet (50 mg total) by mouth daily. Patient taking differently: Take 25 mg by mouth daily.  04/25/15  Yes Shirline Frees, NP  memantine (NAMENDA XR) 28 MG CP24 24 hr capsule Take 28 mg by mouth daily.   Yes Historical Provider, MD  ranitidine (ZANTAC) 300 MG capsule Take 300 mg by mouth daily.  01/01/15  Yes Historical Provider, MD  sertraline (ZOLOFT) 50 MG tablet TAKE 1 TABLET BY MOUTH ONCE A DAY 05/12/15  Yes Historical Provider, MD  aspirin EC 81 MG tablet Take 81 mg by mouth daily.    Historical Provider, MD  cefUROXime (CEFTIN) 250 MG tablet Take 1 tablet (250 mg total) by mouth 2 (two) times daily with a meal. Take on 06/24/15 and 06/25/15, then stop. 06/23/15   Elease Etienne, MD  cholecalciferol (VITAMIN D) 1000 units tablet Take 1,000 Units by mouth daily.    Historical Provider, MD  COCONUT OIL PO Take 2 capsules by mouth 2 (two) times daily.    Historical Provider, MD  docusate sodium (COLACE) 100 MG capsule Take 1 capsule (100 mg total) by mouth every 12 (twelve) hours. 10/18/15   Jacalyn Lefevre, MD  ezetimibe (ZETIA) 10 MG tablet Take 10 mg by mouth at bedtime.  Historical Provider, MD  HYDROcodone-acetaminophen (NORCO/VICODIN) 5-325 MG tablet Take 2 tablets by mouth every 4 (four) hours as needed. 10/18/15   Jacalyn Lefevre, MD  hydrocortisone (ANUSOL-HC) 25 MG suppository Place 1 suppository (25 mg total) rectally 2 (two) times daily as needed for hemorrhoids or itching. 06/23/15   Elease Etienne, MD  Multiple Vitamin (MULTIVITAMIN WITH MINERALS) TABS tablet Take 1 tablet by mouth daily.    Historical Provider, MD  polyethylene glycol powder (GLYCOLAX/MIRALAX) powder TAKE 17 GRAMS MIXED IN LIQUID BY MOUTH DAILY. 06/07/15   Shirline Frees, NP  traZODone (DESYREL) 50 MG tablet Take 0.5 tablets (25 mg total) by mouth at bedtime. 06/23/15   Elease Etienne, MD   BP 179/81 mmHg  Pulse 78  Temp(Src) 97.8 F (36.6 C)  (Axillary)  Resp 16  SpO2 94% Physical Exam  Constitutional: She appears well-developed and well-nourished.  HENT:  Head: Normocephalic.    Right Ear: External ear normal.  Left Ear: External ear normal.  Nose: Nose normal.  Mouth/Throat: Oropharynx is clear and moist.  Eyes: Conjunctivae and EOM are normal. Pupils are equal, round, and reactive to light.  Neck: Normal range of motion. Neck supple.  Cardiovascular: Normal rate, regular rhythm and normal heart sounds.   Pulmonary/Chest: Effort normal and breath sounds normal.  Abdominal: Soft. Bowel sounds are normal.  Musculoskeletal: Normal range of motion.  Neurological: She is alert.  PT IS ORIENTED PER NORM  Skin: Skin is warm and dry.  Nursing note and vitals reviewed.   ED Course  Procedures (including critical care time) Labs Review Labs Reviewed - No data to display  Imaging Review Dg Thoracic Spine 2 View  10/18/2015  CLINICAL DATA:  Fall, pain, initial encounter. EXAM: THORACIC SPINE 2 VIEWS COMPARISON:  Chest radiograph 06/20/2015. FINDINGS: Osteopenia. There are multiple thoracic compression deformities, the majority of which appear stable. A fracture of the superior endplate of T12 appears new 06/20/2015. L1 compression fracture incidentally imaged. IMPRESSION: 1. T12 superior endplate compression fracture appears new from 06/20/2015 but is age indeterminate. 2. Osteopenia with multiple additional compression deformities in the thoracic spine. Electronically Signed   By: Leanna Battles M.D.   On: 10/18/2015 17:45   Dg Lumbar Spine Complete  10/18/2015  CLINICAL DATA:  Fall, pain.  Initial encounter. EXAM: LUMBAR SPINE - COMPLETE 4+ VIEW COMPARISON:  03/28/2014. FINDINGS: Osteopenia. T12 superior endplate compression fracture appears new. L1 compression fracture has progressed. L2 compression fracture is new. Pre-existing T10 and T11 compression deformities. Alignment is anatomic. Endplate degenerative changes at  L5-S1. Multilevel facet hypertrophy. IMPRESSION: 1. T12 and L2 compression fractures appear new but are age indeterminate. 2. L1 compression fracture has progressed. 3. Additional pre-existing lower thoracic compression fractures. 4. Osteopenia. Electronically Signed   By: Leanna Battles M.D.   On: 10/18/2015 17:41   Ct Head Wo Contrast  10/18/2015  CLINICAL DATA:  Per EMS from adult daycare for fall from wheelchair, hematoma on right forehead, no LOC, cause of fall unknown. Patient has right sided weakness at baseline, per family. Per EMS staff states patient is at mental baseline. EXAM: CT HEAD WITHOUT CONTRAST TECHNIQUE: Contiguous axial images were obtained from the base of the skull through the vertex without intravenous contrast. COMPARISON:  06/20/2015 FINDINGS: Right frontoparietal scalp hematoma.  No skull fracture. Ventricles are normal in configuration. There is ventricular and sulcal enlargement reflecting moderate generalized atrophy. There are multiple small deep white matter and basal gangliar lacune infarcts. There is an old small left  parietal lobe infarct. There are no parenchymal masses or mass effect. There is no evidence a recent cortical infarct. There are no extra-axial masses or abnormal fluid collections. There is no intracranial hemorrhage. Visualized sinuses and mastoid air cells are clear. IMPRESSION: 1. No acute intracranial abnormalities. 2. Right frontoparietal scalp hematoma.  No skull fracture. Electronically Signed   By: Amie Portlandavid  Ormond M.D.   On: 10/18/2015 17:44   I have personally reviewed and evaluated these images and lab results as part of my medical decision-making.   EKG Interpretation None      MDM   Final diagnoses:  T12 compression fracture, initial encounter (HCC)  Compression fracture of L1 lumbar vertebra, closed, initial encounter (HCC)  Accidental fall from chair, initial encounter  Cephalohematoma, non-fetal, traumatic, initial encounter         Jacalyn LefevreJulie Jessicaann Overbaugh, MD 10/18/15 2330

## 2015-10-18 NOTE — ED Notes (Signed)
Patient transported to CT 

## 2015-10-23 ENCOUNTER — Other Ambulatory Visit: Payer: Self-pay | Admitting: Adult Health

## 2015-10-23 NOTE — Telephone Encounter (Signed)
Ok to refill 

## 2015-10-24 NOTE — Telephone Encounter (Signed)
This needs to be done by her PCP

## 2015-11-05 ENCOUNTER — Non-Acute Institutional Stay (SKILLED_NURSING_FACILITY): Admitting: Internal Medicine

## 2015-11-05 ENCOUNTER — Encounter: Payer: Self-pay | Admitting: Internal Medicine

## 2015-11-05 DIAGNOSIS — K219 Gastro-esophageal reflux disease without esophagitis: Secondary | ICD-10-CM | POA: Diagnosis not present

## 2015-11-05 DIAGNOSIS — F329 Major depressive disorder, single episode, unspecified: Secondary | ICD-10-CM | POA: Diagnosis not present

## 2015-11-05 DIAGNOSIS — E559 Vitamin D deficiency, unspecified: Secondary | ICD-10-CM | POA: Diagnosis not present

## 2015-11-05 DIAGNOSIS — F39 Unspecified mood [affective] disorder: Secondary | ICD-10-CM | POA: Diagnosis not present

## 2015-11-05 DIAGNOSIS — F0151 Vascular dementia with behavioral disturbance: Secondary | ICD-10-CM

## 2015-11-05 DIAGNOSIS — G4700 Insomnia, unspecified: Secondary | ICD-10-CM | POA: Diagnosis not present

## 2015-11-05 DIAGNOSIS — F32A Depression, unspecified: Secondary | ICD-10-CM

## 2015-11-05 DIAGNOSIS — F0150 Vascular dementia without behavioral disturbance: Secondary | ICD-10-CM

## 2015-11-05 NOTE — Progress Notes (Signed)
MRN: [id number] Name: Diana GageLorraine M Allison  Sex: female Age: 82 y.o. DOB: 12/18/1932  [facility]: [facility]  Facility/Room: 124 A Level Of Care: SNF Provider: Margit HanksAlexander, Irie Dowson D. Emergency Contacts: Extended Emergency Contact Information Primary Emergency Contact: Michaelle Birkswens,Doretha          [place] Home Phone: [phone] Mobile Phone: [phone] Relation: Daughter Secondary Emergency Contact: Shoffner,Lisa  [place] Mobile Phone: [phone] Relation: Daughter  Code Status:   Allergies: Review of patient's allergies indicates no known allergies.  Chief Complaint  Patient presents with  . New Admit To SNF    Admission to facility    HPI: Patient is 82 y.o. female Hospice pt twith vascular alzheimers dx and FTT who is being admitted to SNF from 5/13-18 for respite care. Her las hospitalization was in 05/2015 for  acute encephalopathy  with no discernable cause . While at SNF pt will be followed for cerebral vascular dx, tx with ASA, depression, tx with zoloft and Vit D def tx with replacement.  Past Medical History  Diagnosis Date  . Hyperlipidemia   . Hypertension   . Cataract   . Dementia     Past Surgical History  Procedure Laterality Date  . Knee surgery    . Vaginal hysterectomy        Medication List       This list is accurate as of: 11/05/15 11:59 PM.  Always use your most recent med list.               acetaminophen 325 MG tablet  Commonly known as:  TYLENOL  Take 650 mg by mouth 2 (two) times daily as needed (pain).     acetaminophen 500 MG tablet  Commonly known as:  TYLENOL  Take 1,000 mg by mouth daily as needed for moderate pain.     aspirin EC 81 MG tablet  Take 81 mg by mouth daily.     cholecalciferol 1000 units tablet  Commonly known as:  VITAMIN D  Take 1,000 Units by mouth daily.     COCONUT OIL PO  Take 2 capsules by mouth 2 (two) times daily.     donepezil 10 MG tablet  Commonly known as:   ARICEPT  Take 10 mg by mouth at bedtime.     hydrocortisone 25 MG suppository  Commonly known as:  ANUSOL-HC  Place 1 suppository (25 mg total) rectally 2 (two) times daily as needed for hemorrhoids or itching.     LORazepam 1 MG tablet  Commonly known as:  ATIVAN  Take 1 mg by mouth daily as needed for anxiety. 1 mg overnight prn for insomnia     Menthol (Topical Analgesic) 5 % Gel  Apply 1 unit as needed in the evening     multivitamin with minerals Tabs tablet  Take 1 tablet by mouth daily.     polyethylene glycol powder powder  Commonly known as:  GLYCOLAX/MIRALAX  TAKE 17 GRAMS MIXED IN LIQUID BY MOUTH DAILY.     ranitidine 300 MG capsule  Commonly known as:  ZANTAC  Take 300 mg by mouth daily.     sertraline 50 MG tablet  Commonly known as:  ZOLOFT  TAKE 1 TABLET BY MOUTH ONCE A DAY     traMADol 50 MG tablet  Commonly known as:  ULTRAM  Take 1 - 2 tablets as needed by mouth every 6 hours for pain     traZODone 50 MG tablet  Commonly known  as:  DESYREL  Take 50 mg by mouth at bedtime.     zinc oxide 11.3 % Crea cream  Commonly known as:  BALMEX  Apply to reddened areas on buttocks as needed for skin protection        Meds ordered this encounter  Medications  . traZODone (DESYREL) 50 MG tablet    Sig: Take 50 mg by mouth at bedtime.  Marland Kitchen zinc oxide (BALMEX) 11.3 % CREA cream    Sig: Apply to reddened areas on buttocks as needed for skin protection  . LORazepam (ATIVAN) 1 MG tablet    Sig: Take 1 mg by mouth daily as needed for anxiety. 1 mg overnight prn for insomnia  . acetaminophen (TYLENOL) 325 MG tablet    Sig: Take 650 mg by mouth 2 (two) times daily as needed (pain).  . Menthol, Topical Analgesic, 5 % GEL    Sig: Apply 1 unit as needed in the evening  . traMADol (ULTRAM) 50 MG tablet    Sig: Take 1 - 2 tablets as needed by mouth every 6 hours for pain  . acetaminophen (TYLENOL) 500 MG tablet    Sig: Take 1,000 mg by mouth daily as needed for moderate  pain.    Immunization History  Administered Date(s) Administered  . Influenza Split 06/30/2012  . Influenza-Unspecified 03/23/2014    Social History  Substance Use Topics  . Smoking status: Never Smoker   . Smokeless tobacco: Never Used  . Alcohol Use: No    Family history is UTO 2/2  dementia  Family History  Problem Relation Age of Onset  . Dementia Neg Hx   . Hypertension Daughter       Review of Systems  UTO 2/2 dementia    Filed Vitals:   11/05/15 0931  BP: 134/77  Pulse: 69  Temp: 95.9 F (35.5 C)  Resp: 16    SpO2 Readings from Last 1 Encounters:  11/05/15 87%        Physical Exam  GENERAL APPEARANCE: Alert, non conversant,  No acute distress.WF sitting in hall in [facility]  SKIN: No diaphoresis rash HEAD: Normocephalic, atraumatic  EYES: Conjunctiva/lids clear. Pupils round, reactive. EOMs intact.  EARS: External exam WNL, canals clear. Hearing grossly normal.  NOSE: No deformity or discharge.  MOUTH/THROAT: Lips w/o lesions  RESPIRATORY: Breathing is even, unlabored. Lung sounds are clear   CARDIOVASCULAR: Heart RRR no murmurs, rubs or gallops. No peripheral edema.   GASTROINTESTINAL: Abdomen is soft, non-tender, not distended w/ normal bowel sounds. GENITOURINARY: Bladder non tender, not distended  MUSCULOSKELETAL: No abnormal joints or musculature NEUROLOGIC:  Cranial nerves 2-12 grossly intact. Moves all extremities  PSYCHIATRIC: flat affect, no behavioral issues  Patient Active Problem List   Diagnosis Date Noted  . Acute encephalopathy 06/20/2015  . Hyperlipidemia 06/20/2015  . Orthostatic hypotension 04/16/2015  . Hypokalemia 04/16/2015  . Abnormal EEG   . Syncope and collapse 04/13/2015  . AKI (acute kidney injury) (HCC)   . Acute cystitis 03/31/2015  . Severe dementia 04/24/2014  . Dementia 03/22/2014  . GERD (gastroesophageal reflux disease) 03/22/2014  . Essential hypertension 03/22/2014  . Pure hypercholesterolemia 03/22/2014        Component Value Date/Time   WBC 3.3* 06/21/2015 0442   WBC 3.1* 10/06/2005 0854   RBC 4.30 06/21/2015 0442   RBC 4.84 10/06/2005 0854   HGB 11.1* 06/21/2015 0442   HGB 12.9 10/06/2005 0854   HCT 35.8* 06/21/2015 0442   HCT 38.8 10/06/2005 0854  PLT 193 06/21/2015 0442   PLT 247 10/06/2005 0854   MCV 83.3 06/21/2015 0442   MCV 80.4* 10/06/2005 0854   LYMPHSABS 2.3 04/13/2015 1715   LYMPHSABS 1.8 10/06/2005 0854   MONOABS 0.3 04/13/2015 1715   MONOABS 0.3 10/06/2005 0854   EOSABS 0.1 04/13/2015 1715   EOSABS 0.1 10/06/2005 0854   BASOSABS 0.0 04/13/2015 1715   BASOSABS 0.0 10/06/2005 0854        Component Value Date/Time   NA 143 06/22/2015 0500   K 4.0 06/22/2015 0500   CL 102 06/22/2015 0500   CO2 31 06/22/2015 0500   GLUCOSE 108* 06/22/2015 0500   BUN 17 06/22/2015 0500   CREATININE 0.85 06/22/2015 0500   CALCIUM 9.1 06/22/2015 0500   PROT 6.9 06/20/2015 1312   ALBUMIN 3.8 06/20/2015 1312   AST 18 06/20/2015 1312   ALT 14 06/20/2015 1312   ALKPHOS 68 06/20/2015 1312   BILITOT 0.8 06/20/2015 1312   GFRNONAA >60 06/22/2015 0500   GFRAA >60 06/22/2015 0500    Lab Results  Component Value Date   HGBA1C 6.3 09/26/2014    Lab Results  Component Value Date   CHOL 242* 04/14/2015   HDL 85 04/14/2015   LDLCALC 137* 04/14/2015   LDLDIRECT 128.9 01/27/2012   TRIG 98 04/14/2015   CHOLHDL 2.8 04/14/2015     Dg Thoracic Spine 2 View  10/18/2015  CLINICAL DATA:  Fall, pain, initial encounter. EXAM: THORACIC SPINE 2 VIEWS COMPARISON:  Chest radiograph 06/20/2015. FINDINGS: Osteopenia. There are multiple thoracic compression deformities, the majority of which appear stable. A fracture of the superior endplate of T12 appears new 06/20/2015. L1 compression fracture incidentally imaged. IMPRESSION: 1. T12 superior endplate compression fracture appears new from 06/20/2015 but is age indeterminate. 2. Osteopenia with multiple additional compression deformities in the  thoracic spine. Electronically Signed   By: Leanna Battles M.D.   On: 10/18/2015 17:45   Dg Lumbar Spine Complete  10/18/2015  CLINICAL DATA:  Fall, pain.  Initial encounter. EXAM: LUMBAR SPINE - COMPLETE 4+ VIEW COMPARISON:  03/28/2014. FINDINGS: Osteopenia. T12 superior endplate compression fracture appears new. L1 compression fracture has progressed. L2 compression fracture is new. Pre-existing T10 and T11 compression deformities. Alignment is anatomic. Endplate degenerative changes at L5-S1. Multilevel facet hypertrophy. IMPRESSION: 1. T12 and L2 compression fractures appear new but are age indeterminate. 2. L1 compression fracture has progressed. 3. Additional pre-existing lower thoracic compression fractures. 4. Osteopenia. Electronically Signed   By: Leanna Battles M.D.   On: 10/18/2015 17:41   Ct Head Wo Contrast  10/18/2015  CLINICAL DATA:  Per EMS from adult daycare for fall from wheelchair, hematoma on right forehead, no LOC, cause of fall unknown. Patient has right sided weakness at baseline, per family. Per EMS staff states patient is at mental baseline. EXAM: CT HEAD WITHOUT CONTRAST TECHNIQUE: Contiguous axial images were obtained from the base of the skull through the vertex without intravenous contrast. COMPARISON:  06/20/2015 FINDINGS: Right frontoparietal scalp hematoma.  No skull fracture. Ventricles are normal in configuration. There is ventricular and sulcal enlargement reflecting moderate generalized atrophy. There are multiple small deep white matter and basal gangliar lacune infarcts. There is an old small left parietal lobe infarct. There are no parenchymal masses or mass effect. There is no evidence a recent cortical infarct. There are no extra-axial masses or abnormal fluid collections. There is no intracranial hemorrhage. Visualized sinuses and mastoid air cells are clear. IMPRESSION: 1. No acute intracranial abnormalities. 2. Right  frontoparietal scalp hematoma.  No skull  fracture. Electronically Signed   By: Amie Portland M.D.   On: 10/18/2015 17:44    Not all labs, radiology exams or other studies done during hospitalization come through on my EPIC note; however they are reviewed by me.    Assessment and Plan  No problem-specific assessment & plan notes found for this encounter.  Time spent > 35 min;> 50% of time with patient was spent reviewing records, labs, tests and studies, counseling and developing plan of care  Randon Goldsmith. Lyn Hollingshead, MD

## 2015-11-11 ENCOUNTER — Encounter: Payer: Self-pay | Admitting: Internal Medicine

## 2015-11-11 DIAGNOSIS — F329 Major depressive disorder, single episode, unspecified: Secondary | ICD-10-CM | POA: Insufficient documentation

## 2015-11-11 DIAGNOSIS — F39 Unspecified mood [affective] disorder: Secondary | ICD-10-CM | POA: Insufficient documentation

## 2015-11-11 DIAGNOSIS — F32A Depression, unspecified: Secondary | ICD-10-CM | POA: Insufficient documentation

## 2015-11-11 DIAGNOSIS — G4700 Insomnia, unspecified: Secondary | ICD-10-CM | POA: Insufficient documentation

## 2015-11-11 DIAGNOSIS — E559 Vitamin D deficiency, unspecified: Secondary | ICD-10-CM | POA: Insufficient documentation

## 2015-11-11 NOTE — Assessment & Plan Note (Signed)
Cont trazodone 50 mg qHS

## 2015-11-11 NOTE — Assessment & Plan Note (Signed)
SNF - pt was being tx with aricept prior, none now; ASA 81 mg daily for vascular component and has meds for mmod disorder

## 2015-11-11 NOTE — Assessment & Plan Note (Signed)
Related to dementia; cnt zoloft 75 mg qHS

## 2015-11-11 NOTE — Assessment & Plan Note (Signed)
Not stated as uncontrolled; helps with aspiration; cnt zantac 300 mg daily

## 2015-11-11 NOTE — Assessment & Plan Note (Signed)
Cont replacement 1000 u daily 

## 2015-11-12 ENCOUNTER — Encounter: Payer: Self-pay | Admitting: Internal Medicine

## 2015-11-12 NOTE — Progress Notes (Signed)
Opened in error

## 2015-12-02 ENCOUNTER — Emergency Department (HOSPITAL_COMMUNITY)

## 2015-12-02 ENCOUNTER — Encounter (HOSPITAL_COMMUNITY): Payer: Self-pay | Admitting: Emergency Medicine

## 2015-12-02 ENCOUNTER — Emergency Department (HOSPITAL_COMMUNITY)
Admission: EM | Admit: 2015-12-02 | Discharge: 2015-12-02 | Disposition: A | Discharge status: Home / Self Care | Attending: Emergency Medicine | Admitting: Emergency Medicine

## 2015-12-02 DIAGNOSIS — E785 Hyperlipidemia, unspecified: Secondary | ICD-10-CM | POA: Diagnosis not present

## 2015-12-02 DIAGNOSIS — F0390 Unspecified dementia without behavioral disturbance: Secondary | ICD-10-CM | POA: Insufficient documentation

## 2015-12-02 DIAGNOSIS — M25532 Pain in left wrist: Secondary | ICD-10-CM | POA: Diagnosis present

## 2015-12-02 DIAGNOSIS — R400 Somnolence: Secondary | ICD-10-CM | POA: Insufficient documentation

## 2015-12-02 DIAGNOSIS — Y929 Unspecified place or not applicable: Secondary | ICD-10-CM | POA: Diagnosis not present

## 2015-12-02 DIAGNOSIS — I10 Essential (primary) hypertension: Secondary | ICD-10-CM | POA: Diagnosis not present

## 2015-12-02 DIAGNOSIS — Y999 Unspecified external cause status: Secondary | ICD-10-CM | POA: Insufficient documentation

## 2015-12-02 DIAGNOSIS — Z79899 Other long term (current) drug therapy: Secondary | ICD-10-CM | POA: Insufficient documentation

## 2015-12-02 DIAGNOSIS — W1830XA Fall on same level, unspecified, initial encounter: Secondary | ICD-10-CM | POA: Insufficient documentation

## 2015-12-02 DIAGNOSIS — S52502A Unspecified fracture of the lower end of left radius, initial encounter for closed fracture: Secondary | ICD-10-CM

## 2015-12-02 DIAGNOSIS — Y939 Activity, unspecified: Secondary | ICD-10-CM | POA: Insufficient documentation

## 2015-12-02 DIAGNOSIS — S52592A Other fractures of lower end of left radius, initial encounter for closed fracture: Secondary | ICD-10-CM | POA: Diagnosis not present

## 2015-12-02 DIAGNOSIS — Z79891 Long term (current) use of opiate analgesic: Secondary | ICD-10-CM | POA: Diagnosis not present

## 2015-12-02 LAB — URINALYSIS, ROUTINE W REFLEX MICROSCOPIC
BILIRUBIN URINE: NEGATIVE
Glucose, UA: NEGATIVE mg/dL
HGB URINE DIPSTICK: NEGATIVE
KETONES UR: NEGATIVE mg/dL
Leukocytes, UA: NEGATIVE
NITRITE: NEGATIVE
PROTEIN: NEGATIVE mg/dL
SPECIFIC GRAVITY, URINE: 1.023 (ref 1.005–1.030)
pH: 6.5 (ref 5.0–8.0)

## 2015-12-02 LAB — CBC WITH DIFFERENTIAL/PLATELET
Basophils Absolute: 0 10*3/uL (ref 0.0–0.1)
Basophils Relative: 1 %
EOS PCT: 2 %
Eosinophils Absolute: 0.1 10*3/uL (ref 0.0–0.7)
HEMATOCRIT: 38 % (ref 36.0–46.0)
Hemoglobin: 11.9 g/dL — ABNORMAL LOW (ref 12.0–15.0)
LYMPHS ABS: 1.6 10*3/uL (ref 0.7–4.0)
LYMPHS PCT: 38 %
MCH: 26.3 pg (ref 26.0–34.0)
MCHC: 31.3 g/dL (ref 30.0–36.0)
MCV: 83.9 fL (ref 78.0–100.0)
MONO ABS: 0.3 10*3/uL (ref 0.1–1.0)
Monocytes Relative: 6 %
Neutro Abs: 2.2 10*3/uL (ref 1.7–7.7)
Neutrophils Relative %: 53 %
PLATELETS: 244 10*3/uL (ref 150–400)
RBC: 4.53 MIL/uL (ref 3.87–5.11)
RDW: 15.1 % (ref 11.5–15.5)
WBC: 4.2 10*3/uL (ref 4.0–10.5)

## 2015-12-02 LAB — COMPREHENSIVE METABOLIC PANEL
ALT: 14 U/L (ref 14–54)
AST: 18 U/L (ref 15–41)
Albumin: 3.8 g/dL (ref 3.5–5.0)
Alkaline Phosphatase: 68 U/L (ref 38–126)
Anion gap: 6 (ref 5–15)
BILIRUBIN TOTAL: 1.1 mg/dL (ref 0.3–1.2)
BUN: 28 mg/dL — ABNORMAL HIGH (ref 6–20)
CHLORIDE: 107 mmol/L (ref 101–111)
CO2: 33 mmol/L — ABNORMAL HIGH (ref 22–32)
CREATININE: 1.04 mg/dL — AB (ref 0.44–1.00)
Calcium: 9.3 mg/dL (ref 8.9–10.3)
GFR, EST AFRICAN AMERICAN: 57 mL/min — AB (ref >60)
GFR, EST NON AFRICAN AMERICAN: 49 mL/min — AB (ref >60)
Glucose, Bld: 137 mg/dL — ABNORMAL HIGH (ref 65–99)
Potassium: 3.6 mmol/L (ref 3.5–5.1)
Sodium: 146 mmol/L — ABNORMAL HIGH (ref 135–145)
TOTAL PROTEIN: 7 g/dL (ref 6.5–8.1)

## 2015-12-02 NOTE — Discharge Instructions (Signed)
Wrist Fracture °A wrist fracture is a break or crack in one of the bones of your wrist. Your wrist is made up of eight small bones at the palm of your hand (carpal bones) and two long bones that make up your forearm (radius and ulna). °CAUSES °· A direct blow to the wrist. °· Falling on an outstretched hand. °· Trauma, such as a car accident or a fall. °RISK FACTORS °Risk factors for wrist fracture include: °· Participating in contact and high-risk sports, such as skiing, biking, and ice skating. °· Taking steroid medicines. °· Smoking. °· Being female. °· Being Caucasian. °· Drinking more than three alcoholic beverages per day. °· Having low or lowered bone density (osteoporosis or osteopenia). °· Age. Older adults have decreased bone density. °· Women who have had menopause. °· History of previous fractures. °SIGNS AND SYMPTOMS °Symptoms of wrist fractures include tenderness, bruising, and inflammation. Additionally, the wrist may hang in an odd position or appear deformed. °DIAGNOSIS °Diagnosis may include: °· Physical exam. °· X-ray. °TREATMENT °Treatment depends on many factors, including the nature and location of the fracture, your age, and your activity level. Treatment for wrist fracture can be nonsurgical or surgical. °Nonsurgical Treatment °A plaster cast or splint may be applied to your wrist if the bone is in a good position. If the fracture is not in good position, it may be necessary for your health care provider to realign it before applying a splint or cast. Usually, a cast or splint will be worn for several weeks. °Surgical Treatment °Sometimes the position of the bone is so far out of place that surgery is required to apply a device to hold it together as it heals. Depending on the fracture, there are a number of options for holding the bone in place while it heals, such as a cast and metal pins. °HOME CARE INSTRUCTIONS °· Keep your injured wrist elevated and move your fingers as much as  possible. °· Do not put pressure on any part of your cast or splint. It may break. °· Use a plastic bag to protect your cast or splint from water while bathing or showering. Do not lower your cast or splint into water. °· Take medicines only as directed by your health care provider. °· Keep your cast or splint clean and dry. If it becomes wet, damaged, or suddenly feels too tight, contact your health care provider right away. °· Do not use any tobacco products including cigarettes, chewing tobacco, or electronic cigarettes. Tobacco can delay bone healing. If you need help quitting, ask your health care provider. °· Keep all follow-up visits as directed by your health care provider. This is important. °· Ask your health care provider if you should take supplements of calcium and vitamins C and D to promote bone healing. °SEEK MEDICAL CARE IF: °· Your cast or splint is damaged, breaks, or gets wet. °· You have a fever. °· You have chills. °· You have continued severe pain or more swelling than you did before the cast was put on. °SEEK IMMEDIATE MEDICAL CARE IF: °· Your hand or fingernails on the injured arm turn blue or gray, or feel cold or numb. °· You have decreased feeling in the fingers of your injured arm. °MAKE SURE YOU: °· Understand these instructions. °· Will watch your condition. °· Will get help right away if you are not doing well or get worse. °  °This information is not intended to replace advice given to you by your   health care provider. Make sure you discuss any questions you have with your health care provider. °  °Document Released: 03/19/2005 Document Revised: 02/28/2015 Document Reviewed: 06/27/2011 °Elsevier Interactive Patient Education ©2016 [employer] ° °

## 2015-12-02 NOTE — ED Notes (Signed)
Per family, patient is a hospice patient, and also has a hx of dementia

## 2015-12-02 NOTE — ED Notes (Signed)
Pt brought for left hand pain and edema onset after found sitting on floor on Friday. Severe dementia, pt unable to provide history. Patient slept all day yesterday, family was unable to wake patient yesterday, patient would react with fluttering eyes but not waking.

## 2015-12-02 NOTE — ED Provider Notes (Signed)
CSN: [id number]     Arrival date & time 12/02/15  1145 History   First MD Initiated Contact with Patient 12/02/15 1315     Chief Complaint  Patient presents with  . Wrist Pain     (Consider location/radiation/quality/duration/timing/severity/associated sxs/prior Treatment) HPI Patient with history of severe dementia presents with daughter/caretaker. Level V caveat applies. Daughter states she found the patient sitting in the floor Friday after fall from standing. It was unwitnessed. Does not believe she hit her head. States that yesterday she was difficult to arouse throughout the day. She became more alert around 6 PM. Notice swelling and bruising to the left wrist yesterday as well. Home health nurse urged her to bring the patient into the emergency department to be evaluated. Her since the patient is currently at her baseline mental status. She ambulates with assistance and has been ambulatory since the fall. She's had no recent changes to her medication. She is not on any blood thinners. Past Medical History  Diagnosis Date  . Hyperlipidemia   . Hypertension   . Cataract   . Dementia    Past Surgical History  Procedure Laterality Date  . Knee surgery    . Vaginal hysterectomy     Family History  Problem Relation Age of Onset  . Dementia Neg Hx   . Hypertension Daughter    Social History  Substance Use Topics  . Smoking status: Never Smoker   . Smokeless tobacco: Never Used  . Alcohol Use: No   OB History    No data available     Review of Systems  Unable to perform ROS: Dementia      Allergies  Review of patient's allergies indicates no known allergies.  Home Medications   Prior to Admission medications   Medication Sig Start Date End Date Taking? Authorizing Provider  acetaminophen (TYLENOL) 325 MG tablet Take 650 mg by mouth 2 (two) times daily as needed (pain).   Yes Historical Provider, MD  cholecalciferol (VITAMIN D) 1000 units tablet Take 1,000 Units by  mouth daily.   Yes Historical Provider, MD  COCONUT OIL PO Take 2 capsules by mouth 2 (two) times daily.   Yes Historical Provider, MD  LORazepam (ATIVAN) 1 MG tablet Take 1 mg by mouth daily as needed for anxiety. 1 mg overnight prn for insomnia   Yes Historical Provider, MD  Menthol, Topical Analgesic, 5 % GEL Apply 1 unit as needed in the evening   Yes Historical Provider, MD  Multiple Vitamin (MULTIVITAMIN WITH MINERALS) TABS tablet Take 1 tablet by mouth daily.   Yes Historical Provider, MD  polyethylene glycol powder (GLYCOLAX/MIRALAX) powder TAKE 17 GRAMS MIXED IN LIQUID BY MOUTH DAILY. 06/07/15  Yes Shirline Frees, NP  sertraline (ZOLOFT) 50 MG tablet TAKE 1 TABLET BY MOUTH ONCE A DAY 05/12/15  Yes Historical Provider, MD  traMADol (ULTRAM) 50 MG tablet Take 1 - 2 tablets as needed by mouth every 6 hours for pain   Yes Historical Provider, MD  traZODone (DESYREL) 50 MG tablet Take 50 mg by mouth at bedtime.   Yes Historical Provider, MD  zinc oxide (BALMEX) 11.3 % CREA cream Apply to reddened areas on buttocks as needed for skin protection   Yes Historical Provider, MD  hydrocortisone (ANUSOL-HC) 25 MG suppository Place 1 suppository (25 mg total) rectally 2 (two) times daily as needed for hemorrhoids or itching. Patient not taking: Reported on 12/02/2015 06/23/15   Elease Etienne, MD   BP 140/124 mmHg  Pulse 60  Temp(Src) 98.7 F (37.1 C) (Oral)  Resp 18  SpO2 92% Physical Exam  Constitutional: She appears well-developed and well-nourished. No distress.  HENT:  Head: Normocephalic and atraumatic.  Mouth/Throat: Oropharynx is clear and moist.  No evidence of intraoral trauma  Eyes: EOM are normal. Pupils are equal, round, and reactive to light.  Neck: Normal range of motion. Neck supple.  No posterior midline cervical tenderness to palpation.  Cardiovascular: Normal rate and regular rhythm.  Exam reveals no gallop and no friction rub.   No murmur heard. Pulmonary/Chest: Effort  normal and breath sounds normal. No respiratory distress. She has no wheezes. She has no rales. She exhibits no tenderness.  Abdominal: Soft. Bowel sounds are normal. She exhibits no distension and no mass. There is no tenderness. There is no rebound and no guarding.  Musculoskeletal: Normal range of motion. She exhibits edema and tenderness.  Patient with obvious swelling and contusion to the left wrist. Appears to be tender to palpation over the distal radius. Good distal cap refill. Full range of motion of the left elbow without swelling or tenderness. Moving all other extremities spontaneously. Distal pulses are intact. No midline thoracic or lumbar tenderness.  Neurological: She is alert.  Patient is alert but confused. Not following commands. Spontaneous and moving all extremities. Sensation appears to be intact. At baseline per daughter  Skin: Skin is warm and dry. No rash noted. No erythema.  Nursing note and vitals reviewed.   ED Course  Procedures (including critical care time) Labs Review Labs Reviewed  CBC WITH DIFFERENTIAL/PLATELET - Abnormal; Notable for the following:    Hemoglobin 11.9 (*)    All other components within normal limits  COMPREHENSIVE METABOLIC PANEL - Abnormal; Notable for the following:    Sodium 146 (*)    CO2 33 (*)    Glucose, Bld 137 (*)    BUN 28 (*)    Creatinine, Ser 1.04 (*)    GFR calc non Af Amer 49 (*)    GFR calc Af Amer 57 (*)    All other components within normal limits  URINALYSIS, ROUTINE W REFLEX MICROSCOPIC (NOT AT [facility])    Imaging Review Dg Wrist Complete Left  12/02/2015  CLINICAL DATA:  Swallow on left wrist, trauma EXAM: LEFT WRIST - COMPLETE 3+ VIEW COMPARISON:  None. FINDINGS: Four views of the left wrist submitted. There is diffuse osteopenia. Mild impacted nondisplaced fractures distal left radial metaphysis. Significant narrowing of radiocarpal joint space. Sclerotic changes are noted radiocarpal joint. IMPRESSION: Nondisplaced  impacted fracture in distal left radial metaphysis. Diffuse osteopenia. Degenerative changes radiocarpal joint. Electronically Signed   By: Natasha Mead M.D.   On: 12/02/2015 12:25   Ct Head Wo Contrast  12/02/2015  CLINICAL DATA:  Fall.  Demented. EXAM: CT HEAD WITHOUT CONTRAST TECHNIQUE: Contiguous axial images were obtained from the base of the skull through the vertex without intravenous contrast. COMPARISON:  10/18/2015 FINDINGS: Sinuses/Soft tissues: Decrease in now minimal right frontal scalp soft tissue swelling, including on image 34/series 3. No skull fracture. Clear paranasal sinuses and mastoid air cells. Intracranial: Advanced cerebral atrophy. Moderate to marked low density in the periventricular white matter likely related to small vessel disease. Ventriculomegaly which is likely related to atrophy. Remote lacunar infarcts involving the basal ganglia bilaterally. No mass lesion, hemorrhage, hydrocephalus, acute infarct, intra-axial, or extra-axial fluid collection. IMPRESSION: 1.  No acute intracranial abnormality. 2.  Cerebral atrophy and small vessel ischemic change. 3. Decrease in minimal right  frontal scalp soft tissue swelling. Electronically Signed   By: Jeronimo GreavesKyle  Talbot M.D.   On: 12/02/2015 14:45   I have personally reviewed and evaluated these images and lab results as part of my medical decision-making.   EKG Interpretation None      MDM   Final diagnoses:  Distal radius fracture, left, closed, initial encounter  Intermittent drowsiness   Pt remains at baseline mental status. Suspect that some of the drowsiness may be medication related. Splint applied in ED. Will give f/u with hand and advised to make appt to see PMD.      Loren Raceravid Sherol Sabas, MD 12/02/15 [id number]

## 2015-12-10 ENCOUNTER — Other Ambulatory Visit: Payer: Self-pay | Admitting: Adult Health

## 2015-12-11 NOTE — Telephone Encounter (Signed)
Can you forward this to Dr. Tiburcio PeaHarris. Thank you

## 2015-12-22 DEATH — deceased

## 2016-10-13 IMAGING — CT CT HEAD W/O CM
4 series · 18 of 30 positions shown, 19 images · non-contrast
Comparison: CT scan of January 15, 2015.

CLINICAL DATA: Syncope.

EXAM:
CT HEAD WITHOUT CONTRAST
TECHNIQUE: Contiguous axial images were obtained from the base of the skull
through the vertex without intravenous contrast.

[Series 3: head bone · axial · 0.40mm/px · z∈[-135,-71]mm · 5 of 74 slices shown (1 of 2)]
[im 9/74  bone]
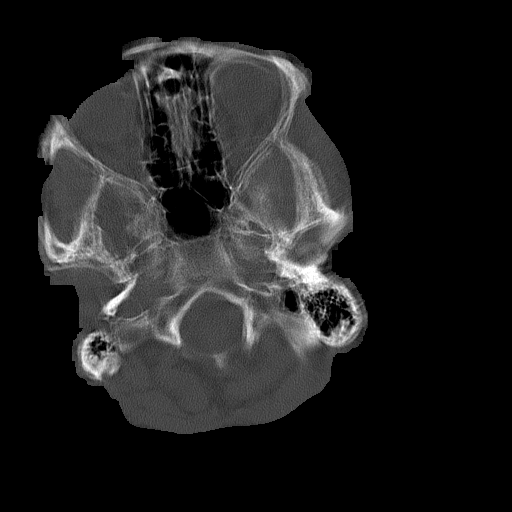
[im 17/74  bone]
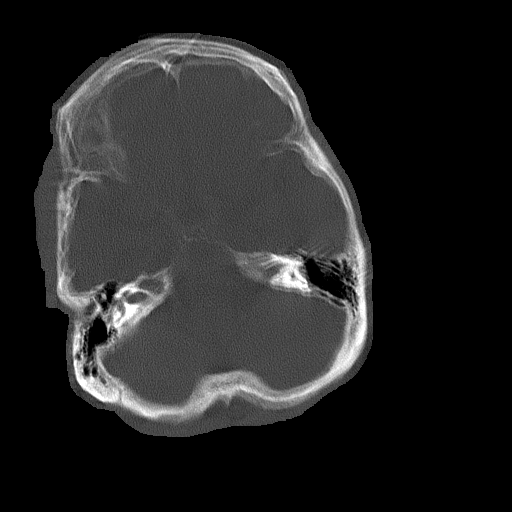
[im 25/74  bone]
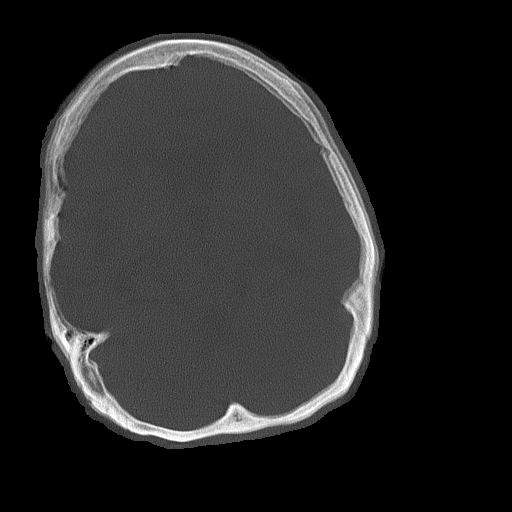
[im 33/74  bone]
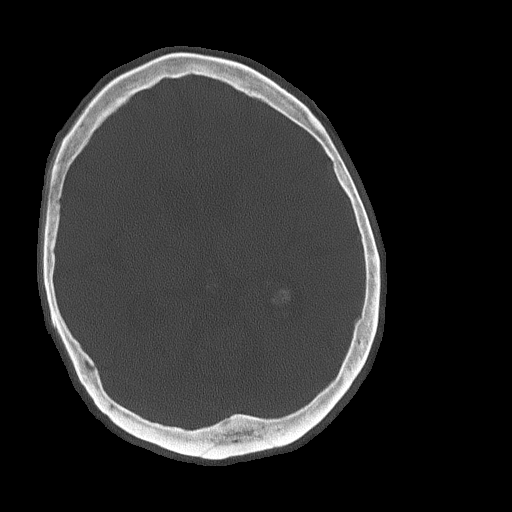
[im 41/74  bone]
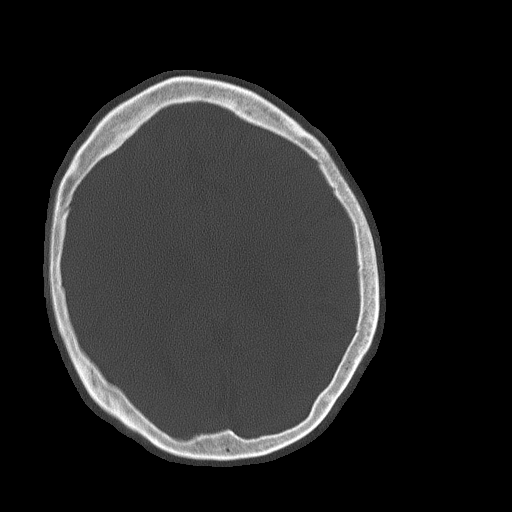

[Series 8: head bone · axial · 0.39mm/px · z∈[-139,-23]mm · 8 of 74 slices shown (2 of 2)]
[im 8/74  bone]
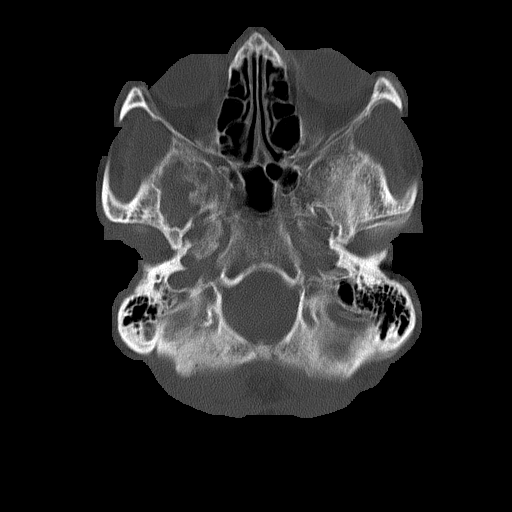
[im 15/74  bone]
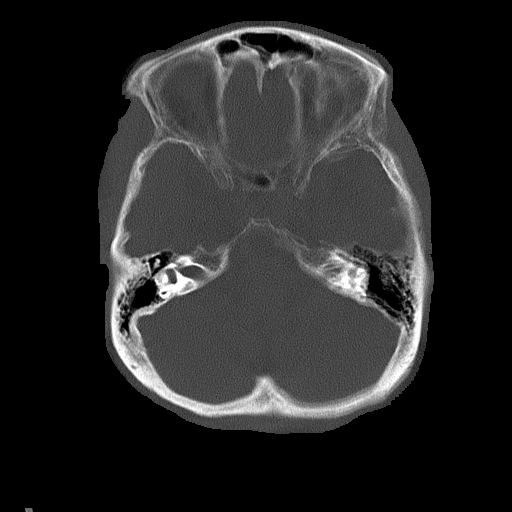
[im 22/74  bone]
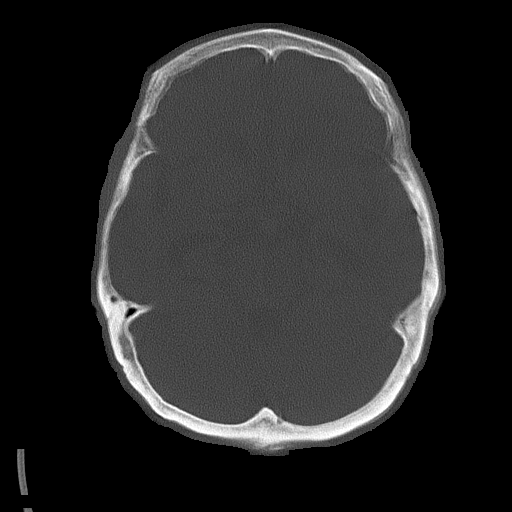
[im 30/74  bone]
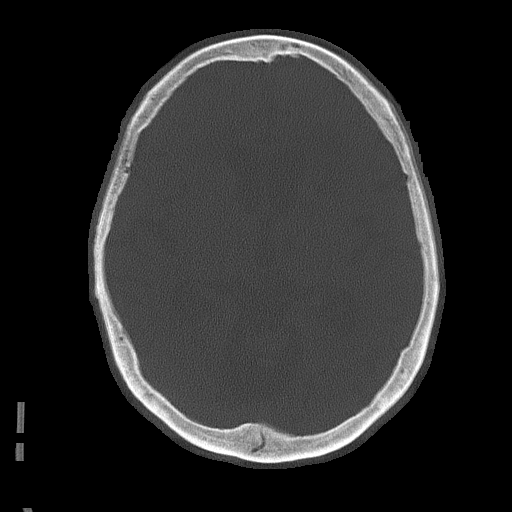
[im 44/74  bone]
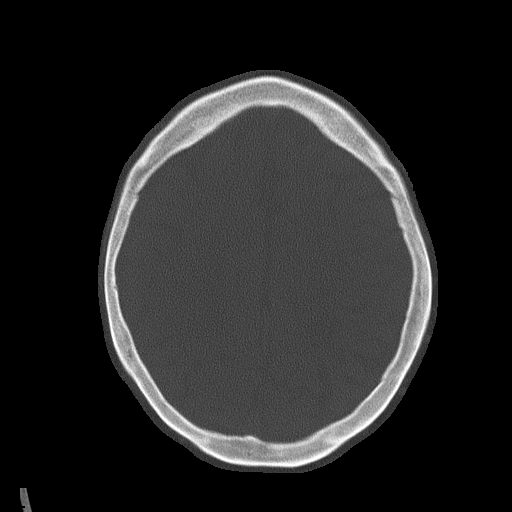
[im 52/74  bone]
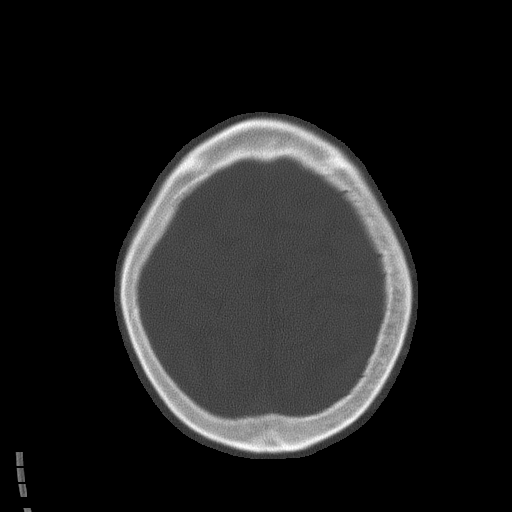
[im 59/74  bone]
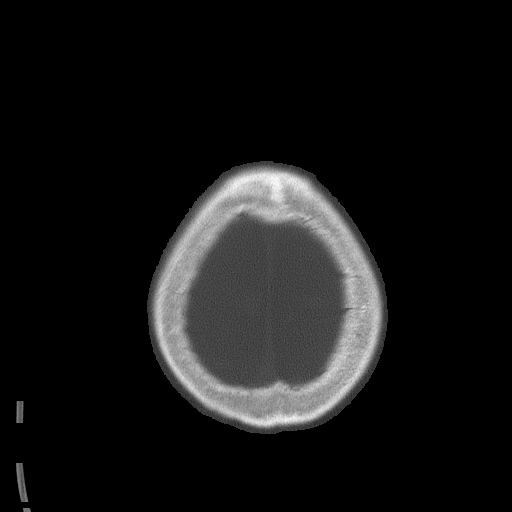
[im 66/74  bone]
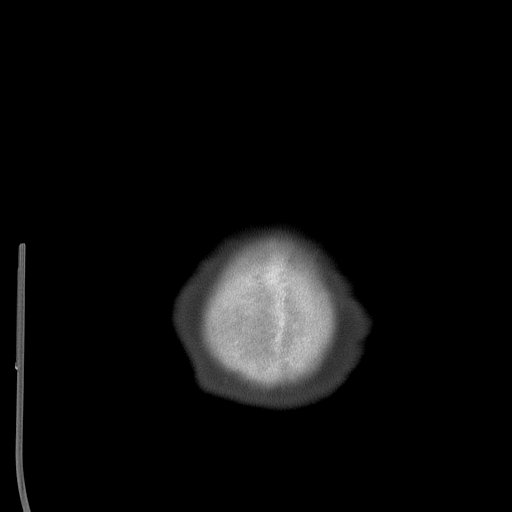

[Series 10: head without · axial · non-contrast · 0.39mm/px · z∈[-128,-48]mm · 3 of 32 slices shown, 4 images (1 of 2)]
[im 8/32  brain]
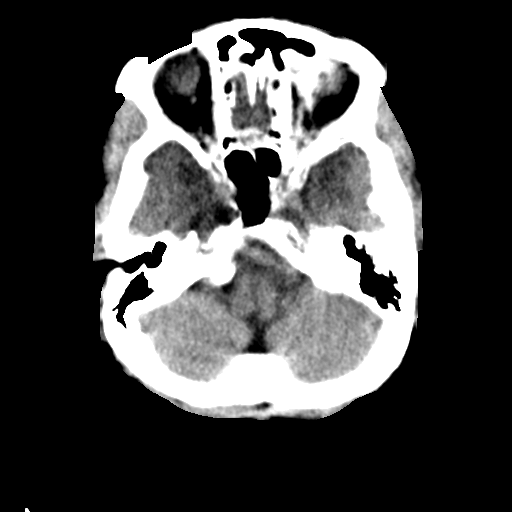
[im 8/32  bone]
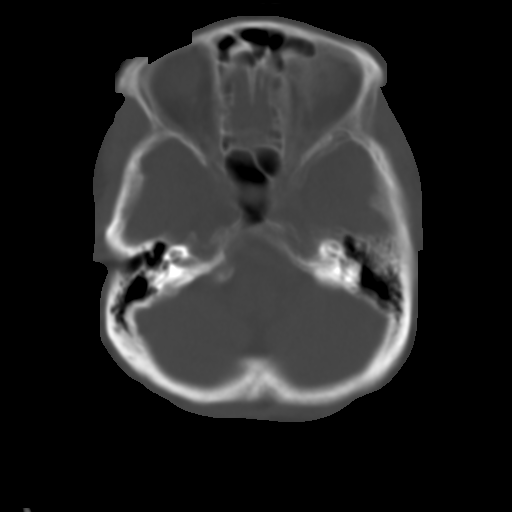
[im 16/32  brain]
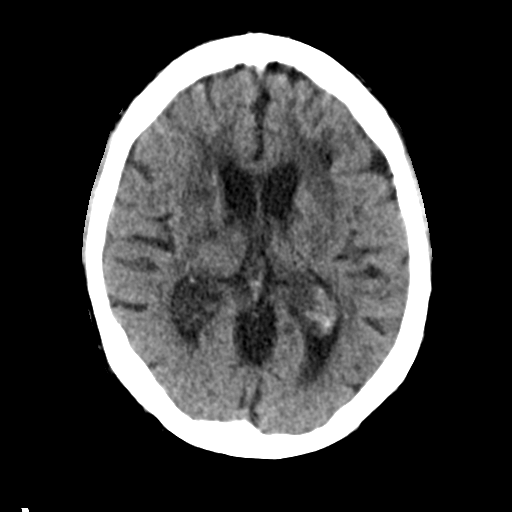
[im 24/32  brain]
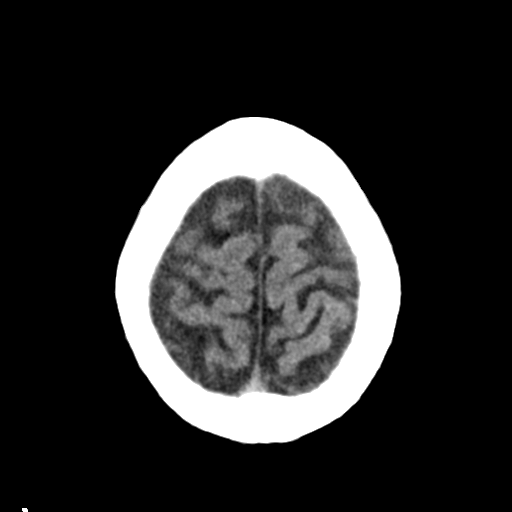

[Series 12: head without · axial · non-contrast · 0.39mm/px · z∈[-105,-55]mm · 2 of 30 slices shown (2 of 2)]
[im 10/30  brain]
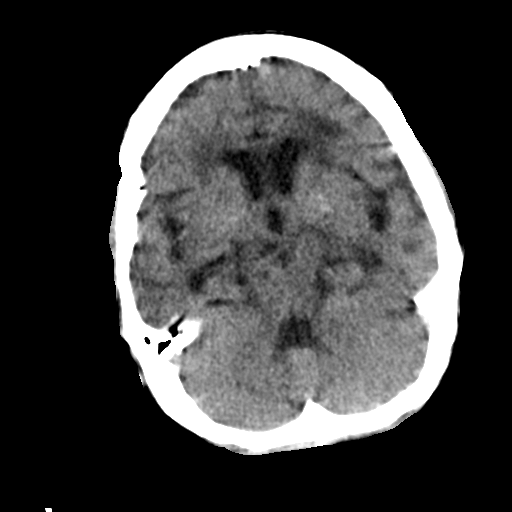
[im 20/30  brain]
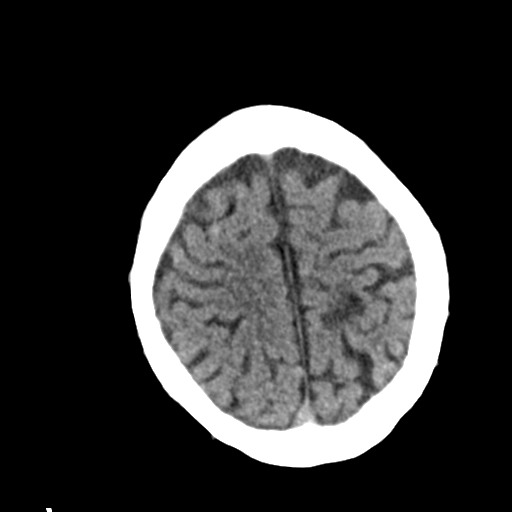

[18 of 30 positions shown; findings below may reference images not displayed]

FINDINGS: Bony calvarium appears intact. Diffuse cortical atrophy is noted.
Chronic ischemic white matter disease is noted. Old left parietal
infarction is noted. No mass effect or midline shift is noted.
Ventricular size is within normal limits. There is no evidence of
mass lesion, hemorrhage or acute infarction.
IMPRESSION: Diffuse cortical atrophy. Chronic ischemic white matter disease. Old
left parietal infarction. No acute intracranial abnormality seen.
